# Patient Record
Sex: Female | Born: 1980 | Race: White | Hispanic: No | Marital: Married | Smoking: Former smoker
Health system: Southern US, Community
[De-identification: ages and names within clinical notes are randomized; demographics above are authoritative.]

## PROBLEM LIST (undated history)

## (undated) DIAGNOSIS — F32A Depression, unspecified: Secondary | ICD-10-CM

## (undated) DIAGNOSIS — F1110 Opioid abuse, uncomplicated: Secondary | ICD-10-CM

## (undated) DIAGNOSIS — Z332 Encounter for elective termination of pregnancy: Secondary | ICD-10-CM

## (undated) DIAGNOSIS — F419 Anxiety disorder, unspecified: Secondary | ICD-10-CM

## (undated) DIAGNOSIS — R87629 Unspecified abnormal cytological findings in specimens from vagina: Secondary | ICD-10-CM

## (undated) DIAGNOSIS — N809 Endometriosis, unspecified: Secondary | ICD-10-CM

## (undated) DIAGNOSIS — Z765 Malingerer [conscious simulation]: Secondary | ICD-10-CM

## (undated) DIAGNOSIS — J069 Acute upper respiratory infection, unspecified: Secondary | ICD-10-CM

## (undated) DIAGNOSIS — R569 Unspecified convulsions: Secondary | ICD-10-CM

## (undated) DIAGNOSIS — Z8759 Personal history of other complications of pregnancy, childbirth and the puerperium: Secondary | ICD-10-CM

## (undated) DIAGNOSIS — M797 Fibromyalgia: Secondary | ICD-10-CM

## (undated) DIAGNOSIS — IMO0001 Reserved for inherently not codable concepts without codable children: Secondary | ICD-10-CM

## (undated) DIAGNOSIS — F329 Major depressive disorder, single episode, unspecified: Secondary | ICD-10-CM

## (undated) HISTORY — DX: Reserved for inherently not codable concepts without codable children: IMO0001

## (undated) HISTORY — PX: OTHER SURGICAL HISTORY: SHX169

## (undated) HISTORY — PX: COLPOSCOPY: SHX161

## (undated) HISTORY — DX: Personal history of other complications of pregnancy, childbirth and the puerperium: Z87.59

---

## 1998-05-28 ENCOUNTER — Other Ambulatory Visit: Admission: RE | Admit: 1998-05-28 | Discharge: 1998-05-28 | Payer: Self-pay | Admitting: Obstetrics and Gynecology

## 1998-06-26 HISTORY — PX: DILATION AND CURETTAGE OF UTERUS: SHX78

## 1998-11-21 ENCOUNTER — Emergency Department (HOSPITAL_COMMUNITY): Admission: EM | Admit: 1998-11-21 | Discharge: 1998-11-21 | Payer: Self-pay | Admitting: Emergency Medicine

## 1998-12-22 ENCOUNTER — Inpatient Hospital Stay (HOSPITAL_COMMUNITY): Admission: RE | Admit: 1998-12-22 | Discharge: 1998-12-24 | Payer: Self-pay | Admitting: *Deleted

## 1998-12-27 ENCOUNTER — Ambulatory Visit (HOSPITAL_COMMUNITY): Admission: RE | Admit: 1998-12-27 | Discharge: 1999-01-24 | Payer: Self-pay

## 1999-03-27 DIAGNOSIS — Z332 Encounter for elective termination of pregnancy: Secondary | ICD-10-CM

## 1999-03-27 HISTORY — DX: Encounter for elective termination of pregnancy: Z33.2

## 1999-04-18 ENCOUNTER — Other Ambulatory Visit: Admission: RE | Admit: 1999-04-18 | Discharge: 1999-04-18 | Payer: Self-pay | Admitting: Family Medicine

## 1999-08-17 ENCOUNTER — Emergency Department (HOSPITAL_COMMUNITY): Admission: EM | Admit: 1999-08-17 | Discharge: 1999-08-17 | Payer: Self-pay | Admitting: Emergency Medicine

## 1999-08-18 ENCOUNTER — Encounter (INDEPENDENT_AMBULATORY_CARE_PROVIDER_SITE_OTHER): Payer: Self-pay

## 1999-08-18 ENCOUNTER — Other Ambulatory Visit: Admission: RE | Admit: 1999-08-18 | Discharge: 1999-08-18 | Payer: Self-pay | Admitting: Obstetrics and Gynecology

## 2000-02-19 ENCOUNTER — Emergency Department (HOSPITAL_COMMUNITY): Admission: EM | Admit: 2000-02-19 | Discharge: 2000-02-19 | Payer: Self-pay | Admitting: Emergency Medicine

## 2000-02-19 ENCOUNTER — Encounter: Payer: Self-pay | Admitting: Emergency Medicine

## 2000-03-19 ENCOUNTER — Other Ambulatory Visit: Admission: RE | Admit: 2000-03-19 | Discharge: 2000-03-19 | Payer: Self-pay | Admitting: Family Medicine

## 2000-06-26 HISTORY — PX: WISDOM TOOTH EXTRACTION: SHX21

## 2000-08-16 ENCOUNTER — Other Ambulatory Visit: Admission: RE | Admit: 2000-08-16 | Discharge: 2000-08-16 | Payer: Self-pay | Admitting: Family Medicine

## 2001-06-26 DIAGNOSIS — Z8759 Personal history of other complications of pregnancy, childbirth and the puerperium: Secondary | ICD-10-CM

## 2001-06-26 HISTORY — DX: Personal history of other complications of pregnancy, childbirth and the puerperium: Z87.59

## 2001-12-24 HISTORY — PX: DILATION AND EVACUATION: SHX1459

## 2002-01-01 ENCOUNTER — Inpatient Hospital Stay (HOSPITAL_COMMUNITY): Admission: EM | Admit: 2002-01-01 | Discharge: 2002-01-06 | Payer: Self-pay | Admitting: Psychiatry

## 2002-01-06 ENCOUNTER — Encounter: Payer: Self-pay | Admitting: Emergency Medicine

## 2002-01-06 ENCOUNTER — Encounter (INDEPENDENT_AMBULATORY_CARE_PROVIDER_SITE_OTHER): Payer: Self-pay | Admitting: *Deleted

## 2002-01-06 ENCOUNTER — Ambulatory Visit (HOSPITAL_COMMUNITY): Admission: AD | Admit: 2002-01-06 | Discharge: 2002-01-06 | Payer: Self-pay | Admitting: Obstetrics and Gynecology

## 2002-01-07 ENCOUNTER — Other Ambulatory Visit (HOSPITAL_COMMUNITY): Admission: RE | Admit: 2002-01-07 | Discharge: 2002-01-31 | Payer: Self-pay | Admitting: Internal Medicine

## 2002-03-11 ENCOUNTER — Encounter: Payer: Self-pay | Admitting: Family Medicine

## 2002-03-11 ENCOUNTER — Encounter: Admission: RE | Admit: 2002-03-11 | Discharge: 2002-03-11 | Payer: Self-pay | Admitting: Family Medicine

## 2003-01-10 ENCOUNTER — Emergency Department (HOSPITAL_COMMUNITY): Admission: AD | Admit: 2003-01-10 | Discharge: 2003-01-11 | Payer: Self-pay | Admitting: Emergency Medicine

## 2003-07-03 ENCOUNTER — Emergency Department (HOSPITAL_COMMUNITY): Admission: AD | Admit: 2003-07-03 | Discharge: 2003-07-03 | Payer: Self-pay

## 2003-07-13 ENCOUNTER — Emergency Department (HOSPITAL_COMMUNITY): Admission: AD | Admit: 2003-07-13 | Discharge: 2003-07-13 | Payer: Self-pay | Admitting: Family Medicine

## 2003-12-07 ENCOUNTER — Ambulatory Visit (HOSPITAL_COMMUNITY): Admission: RE | Admit: 2003-12-07 | Discharge: 2003-12-07 | Payer: Self-pay | Admitting: Psychiatry

## 2004-01-06 ENCOUNTER — Encounter: Admission: RE | Admit: 2004-01-06 | Discharge: 2004-01-06 | Payer: Self-pay | Admitting: *Deleted

## 2004-01-20 ENCOUNTER — Encounter: Admission: RE | Admit: 2004-01-20 | Discharge: 2004-01-20 | Payer: Self-pay | Admitting: *Deleted

## 2004-02-10 ENCOUNTER — Encounter: Admission: RE | Admit: 2004-02-10 | Discharge: 2004-02-10 | Payer: Self-pay | Admitting: *Deleted

## 2004-03-02 ENCOUNTER — Ambulatory Visit (HOSPITAL_COMMUNITY): Admission: RE | Admit: 2004-03-02 | Discharge: 2004-03-02 | Payer: Self-pay | Admitting: *Deleted

## 2004-03-02 ENCOUNTER — Ambulatory Visit: Payer: Self-pay | Admitting: *Deleted

## 2004-03-10 ENCOUNTER — Ambulatory Visit: Payer: Self-pay | Admitting: Obstetrics and Gynecology

## 2004-03-23 ENCOUNTER — Ambulatory Visit: Payer: Self-pay | Admitting: *Deleted

## 2004-04-07 ENCOUNTER — Ambulatory Visit: Payer: Self-pay | Admitting: Family Medicine

## 2004-04-18 ENCOUNTER — Inpatient Hospital Stay (HOSPITAL_COMMUNITY): Admission: AD | Admit: 2004-04-18 | Discharge: 2004-04-18 | Payer: Self-pay | Admitting: Obstetrics & Gynecology

## 2004-04-27 ENCOUNTER — Ambulatory Visit: Payer: Self-pay | Admitting: Obstetrics & Gynecology

## 2004-05-11 ENCOUNTER — Ambulatory Visit: Payer: Self-pay | Admitting: Family Medicine

## 2004-05-11 ENCOUNTER — Ambulatory Visit (HOSPITAL_COMMUNITY): Admission: RE | Admit: 2004-05-11 | Discharge: 2004-05-11 | Payer: Self-pay | Admitting: Obstetrics & Gynecology

## 2004-05-26 ENCOUNTER — Ambulatory Visit: Payer: Self-pay | Admitting: Family Medicine

## 2004-06-16 ENCOUNTER — Ambulatory Visit: Payer: Self-pay | Admitting: Family Medicine

## 2004-06-23 ENCOUNTER — Ambulatory Visit: Payer: Self-pay | Admitting: Family Medicine

## 2004-06-30 ENCOUNTER — Ambulatory Visit: Payer: Self-pay | Admitting: Family Medicine

## 2004-07-07 ENCOUNTER — Ambulatory Visit: Payer: Self-pay | Admitting: Family Medicine

## 2004-07-14 ENCOUNTER — Ambulatory Visit: Payer: Self-pay | Admitting: Family Medicine

## 2004-07-21 ENCOUNTER — Ambulatory Visit: Payer: Self-pay | Admitting: Family Medicine

## 2004-07-27 ENCOUNTER — Ambulatory Visit: Payer: Self-pay | Admitting: *Deleted

## 2004-07-28 ENCOUNTER — Ambulatory Visit: Payer: Self-pay | Admitting: Obstetrics and Gynecology

## 2004-07-28 ENCOUNTER — Inpatient Hospital Stay (HOSPITAL_COMMUNITY): Admission: AD | Admit: 2004-07-28 | Discharge: 2004-08-02 | Payer: Self-pay | Admitting: *Deleted

## 2004-07-31 ENCOUNTER — Encounter (INDEPENDENT_AMBULATORY_CARE_PROVIDER_SITE_OTHER): Payer: Self-pay | Admitting: *Deleted

## 2004-08-11 ENCOUNTER — Emergency Department (HOSPITAL_COMMUNITY): Admission: EM | Admit: 2004-08-11 | Discharge: 2004-08-11 | Payer: Self-pay | Admitting: Family Medicine

## 2005-11-09 ENCOUNTER — Emergency Department (HOSPITAL_COMMUNITY): Admission: EM | Admit: 2005-11-09 | Discharge: 2005-11-09 | Payer: Self-pay | Admitting: Emergency Medicine

## 2005-12-11 ENCOUNTER — Emergency Department (HOSPITAL_COMMUNITY): Admission: EM | Admit: 2005-12-11 | Discharge: 2005-12-11 | Payer: Self-pay | Admitting: Family Medicine

## 2006-02-24 ENCOUNTER — Emergency Department (HOSPITAL_COMMUNITY): Admission: EM | Admit: 2006-02-24 | Discharge: 2006-02-24 | Payer: Self-pay | Admitting: Family Medicine

## 2006-02-27 ENCOUNTER — Emergency Department (HOSPITAL_COMMUNITY): Admission: EM | Admit: 2006-02-27 | Discharge: 2006-02-27 | Payer: Self-pay | Admitting: Family Medicine

## 2006-03-29 ENCOUNTER — Emergency Department (HOSPITAL_COMMUNITY): Admission: EM | Admit: 2006-03-29 | Discharge: 2006-03-29 | Payer: Self-pay | Admitting: Family Medicine

## 2006-07-20 ENCOUNTER — Emergency Department (HOSPITAL_COMMUNITY): Admission: EM | Admit: 2006-07-20 | Discharge: 2006-07-20 | Payer: Self-pay | Admitting: Family Medicine

## 2007-02-10 ENCOUNTER — Emergency Department (HOSPITAL_COMMUNITY): Admission: EM | Admit: 2007-02-10 | Discharge: 2007-02-10 | Payer: Self-pay | Admitting: Emergency Medicine

## 2007-04-08 ENCOUNTER — Emergency Department (HOSPITAL_COMMUNITY): Admission: EM | Admit: 2007-04-08 | Discharge: 2007-04-08 | Payer: Self-pay | Admitting: Emergency Medicine

## 2007-08-30 ENCOUNTER — Emergency Department (HOSPITAL_COMMUNITY): Admission: EM | Admit: 2007-08-30 | Discharge: 2007-08-30 | Payer: Self-pay | Admitting: Family Medicine

## 2007-10-18 ENCOUNTER — Ambulatory Visit: Payer: Self-pay | Admitting: Internal Medicine

## 2007-10-18 ENCOUNTER — Encounter (INDEPENDENT_AMBULATORY_CARE_PROVIDER_SITE_OTHER): Payer: Self-pay | Admitting: Nurse Practitioner

## 2007-10-18 LAB — CONVERTED CEMR LAB
Albumin: 4.7 g/dL (ref 3.5–5.2)
Alkaline Phosphatase: 38 units/L — ABNORMAL LOW (ref 39–117)
BUN: 16 mg/dL (ref 6–23)
Calcium: 9.2 mg/dL (ref 8.4–10.5)
Chloride: 105 meq/L (ref 96–112)
Eosinophils Absolute: 0.2 10*3/uL (ref 0.0–0.7)
Glucose, Bld: 84 mg/dL (ref 70–99)
Hemoglobin: 13.4 g/dL (ref 12.0–15.0)
Lymphs Abs: 2 10*3/uL (ref 0.7–4.0)
MCV: 92.5 fL (ref 78.0–100.0)
Monocytes Relative: 6 % (ref 3–12)
Neutrophils Relative %: 60 % (ref 43–77)
Potassium: 4 meq/L (ref 3.5–5.3)
RBC: 4.26 M/uL (ref 3.87–5.11)
WBC: 6.5 10*3/uL (ref 4.0–10.5)

## 2007-10-21 ENCOUNTER — Ambulatory Visit (HOSPITAL_COMMUNITY): Admission: RE | Admit: 2007-10-21 | Discharge: 2007-10-21 | Payer: Self-pay | Admitting: Family Medicine

## 2008-04-01 ENCOUNTER — Emergency Department (HOSPITAL_COMMUNITY): Admission: EM | Admit: 2008-04-01 | Discharge: 2008-04-01 | Payer: Self-pay | Admitting: Emergency Medicine

## 2008-06-28 ENCOUNTER — Emergency Department (HOSPITAL_COMMUNITY): Admission: EM | Admit: 2008-06-28 | Discharge: 2008-06-28 | Payer: Self-pay | Admitting: Family Medicine

## 2008-07-06 ENCOUNTER — Emergency Department (HOSPITAL_COMMUNITY): Admission: EM | Admit: 2008-07-06 | Discharge: 2008-07-06 | Payer: Self-pay | Admitting: Family Medicine

## 2008-08-02 ENCOUNTER — Emergency Department (HOSPITAL_COMMUNITY): Admission: EM | Admit: 2008-08-02 | Discharge: 2008-08-02 | Payer: Self-pay | Admitting: Emergency Medicine

## 2008-11-18 ENCOUNTER — Other Ambulatory Visit: Admission: RE | Admit: 2008-11-18 | Discharge: 2008-11-18 | Payer: Self-pay | Admitting: Family Medicine

## 2009-04-29 ENCOUNTER — Inpatient Hospital Stay (HOSPITAL_COMMUNITY): Admission: AD | Admit: 2009-04-29 | Discharge: 2009-04-30 | Payer: Self-pay | Admitting: Obstetrics and Gynecology

## 2009-08-25 ENCOUNTER — Inpatient Hospital Stay (HOSPITAL_COMMUNITY): Admission: AD | Admit: 2009-08-25 | Discharge: 2009-08-25 | Payer: Self-pay | Admitting: Obstetrics and Gynecology

## 2009-08-30 ENCOUNTER — Inpatient Hospital Stay (HOSPITAL_COMMUNITY): Admission: AD | Admit: 2009-08-30 | Discharge: 2009-09-01 | Payer: Self-pay | Admitting: Obstetrics and Gynecology

## 2010-04-14 ENCOUNTER — Encounter: Admission: RE | Admit: 2010-04-14 | Discharge: 2010-04-14 | Payer: Self-pay | Admitting: Family Medicine

## 2010-07-17 ENCOUNTER — Encounter: Payer: Self-pay | Admitting: Psychiatry

## 2010-09-18 LAB — COMPREHENSIVE METABOLIC PANEL
ALT: 11 U/L (ref 0–35)
AST: 30 U/L (ref 0–37)
Calcium: 7.8 mg/dL — ABNORMAL LOW (ref 8.4–10.5)
GFR calc Af Amer: >60 mL/min (ref >60)
Sodium: 134 mEq/L — ABNORMAL LOW (ref 135–145)
Total Protein: 4.4 g/dL — ABNORMAL LOW (ref 6.0–8.3)

## 2010-09-18 LAB — CBC
Hemoglobin: 10.8 g/dL — ABNORMAL LOW (ref 12.0–15.0)
MCHC: 33.3 g/dL (ref 30.0–36.0)
MCV: 100.1 fL — ABNORMAL HIGH (ref 78.0–100.0)
Platelets: 211 10*3/uL (ref 150–400)
RBC: 3.24 MIL/uL — ABNORMAL LOW (ref 3.87–5.11)
RDW: 13.2 % (ref 11.5–15.5)

## 2010-09-18 LAB — LACTATE DEHYDROGENASE: LDH: 181 U/L (ref 94–250)

## 2010-09-28 LAB — DIFFERENTIAL
Basophils Absolute: 0 10*3/uL (ref 0.0–0.1)
Eosinophils Relative: 0 % (ref 0–5)
Lymphocytes Relative: 7 % — ABNORMAL LOW (ref 12–46)
Lymphs Abs: 0.6 10*3/uL — ABNORMAL LOW (ref 0.7–4.0)
Neutro Abs: 7.7 10*3/uL (ref 1.7–7.7)
Neutrophils Relative %: 87 % — ABNORMAL HIGH (ref 43–77)

## 2010-09-28 LAB — URINALYSIS, ROUTINE W REFLEX MICROSCOPIC
Glucose, UA: 100 mg/dL — AB
Nitrite: NEGATIVE
Specific Gravity, Urine: 1.015 (ref 1.005–1.030)
pH: 7.5 (ref 5.0–8.0)

## 2010-09-28 LAB — COMPREHENSIVE METABOLIC PANEL
ALT: 12 U/L (ref 0–35)
CO2: 24 mEq/L (ref 19–32)
Calcium: 8 mg/dL — ABNORMAL LOW (ref 8.4–10.5)
Chloride: 105 mEq/L (ref 96–112)
GFR calc non Af Amer: >60 mL/min (ref >60)
Glucose, Bld: 83 mg/dL (ref 70–99)
Sodium: 134 mEq/L — ABNORMAL LOW (ref 135–145)
Total Bilirubin: 0.7 mg/dL (ref 0.3–1.2)

## 2010-09-28 LAB — CBC
HCT: 32.3 % — ABNORMAL LOW (ref 36.0–46.0)
Platelets: 212 10*3/uL (ref 150–400)
WBC: 8.6 10*3/uL (ref 4.0–10.5)

## 2010-10-11 LAB — URINALYSIS, ROUTINE W REFLEX MICROSCOPIC
Nitrite: NEGATIVE
Specific Gravity, Urine: 1.029 (ref 1.005–1.030)
Urobilinogen, UA: 1 mg/dL (ref 0.0–1.0)
pH: 5.5 (ref 5.0–8.0)

## 2010-10-11 LAB — POCT I-STAT, CHEM 8
Calcium, Ion: 1.16 mmol/L (ref 1.12–1.32)
Creatinine, Ser: 0.7 mg/dL (ref 0.4–1.2)
Glucose, Bld: 89 mg/dL (ref 70–99)
Hemoglobin: 13.6 g/dL (ref 12.0–15.0)
Potassium: 3.6 mEq/L (ref 3.5–5.1)

## 2010-10-11 LAB — URINE CULTURE
Colony Count: NO GROWTH
Culture: NO GROWTH

## 2010-10-11 LAB — DIFFERENTIAL
Basophils Absolute: 0 10*3/uL (ref 0.0–0.1)
Basophils Relative: 0 % (ref 0–1)
Eosinophils Absolute: 0.2 10*3/uL (ref 0.0–0.7)
Eosinophils Relative: 2 % (ref 0–5)
Lymphs Abs: 1.3 10*3/uL (ref 0.7–4.0)
Neutrophils Relative %: 69 % (ref 43–77)

## 2010-10-11 LAB — URINE MICROSCOPIC-ADD ON

## 2010-10-11 LAB — CBC
HCT: 37.7 % (ref 36.0–46.0)
MCHC: 35 g/dL (ref 30.0–36.0)
MCV: 93.8 fL (ref 78.0–100.0)
Platelets: 244 10*3/uL (ref 150–400)
RDW: 12.6 % (ref 11.5–15.5)

## 2010-10-11 LAB — MONONUCLEOSIS SCREEN: Mono Screen: NEGATIVE

## 2010-11-11 NOTE — Op Note (Signed)
[facility]  Patient:    Emily Woodard, Emily Woodard Visit Number: [id number] MRN: [id number]          Service Type: DSU Location: [facility] Attending Physician:  Leonard Schwartz Dictated by:   Janine Limbo, M.D. Proc. Date: 01/06/02 Admit Date:  01/06/2002   CC:         Charlies Silvers, M.D.  Jeanice Lim, M.D.   Operative Report  PREOPERATIVE DIAGNOSES:       1. First trimester threatened abortion.                               2. Rule out right ectopic pregnancy.  POSTOPERATIVE DIAGNOSES:      1. First trimester threatened abortion.                               2. Rule out right ectopic pregnancy.  PROCEDURE:                    Suction dilatation and evacuation.  SURGEON:                      Janine Limbo, M.D.  FIRST ASSISTANT:              None.  ANESTHESIA:                   Monitored anesthetic control, paracervical block.  DISPOSITION:                  Ms. Emily Woodard is a 30 year old female gravida 2, para 0-0-1-0 who presents with a questionable last menstrual period of November 30, 2001.  This makes the patient 5 weeks and 4 days pregnant.  The patient has had vaginal spotting and right lower quadrant pain.  The patient had a quantitative beta hCG done on January 01, 2002 that was 1800.  A repeat quantative beta hCG was done on the morning of surgery and it was 1284.  An ultrasound was obtained that showed no intrauterine pregnancy.  There was a thick walled 2 cm cystic structure in the right adnexa.  The patient has currently been an inpatient at [facility] where she is being treated for substance abuse.  She is doing well at this time.  Her hemoglobin was 38.1%.  Her platelet count was 355,000.  Her SGOT and SGPT as well as BUN and creatinine are within normal limits.  The patient understands the indications for her surgical procedure and she accepts the risks of, but not limited to, anesthetic complications,  bleeding, infections, and possible damage to the surrounding organs.  The plan is to obtain a frozen section from the pathology specimen.  If no products of conception are discovered then we will proceed with methotrexate therapy.  The protocol is for her to have a repeat quantitative beta hCG performed on day 4 and then again repeat quantitative beta hCG on day 7.  She will have weekly quantitative beta hCG values until her result is 0.  If after one weeks time the patient has not had a significant enough drop in her quantitative beta hCGs, then we will give her a second dose of methotrexate.  FINDINGS:                     The patients blood  type is A+.  A small amount of tissue was removed from within the uterine cavity.  The uterus was noted to be normal size.  There was a cystic structure present in the right adnexa.  PROCEDURE:                    The patient was taken to the operating room where she was given medication through her IV line.  The patients perineum and vagina were prepped with multiple layers of Betadine.  The bladder was drained of urine.  Examination under anesthesia was performed.  The patient was sterilely draped.  A paracervical block was placed using 10 cc of 0.5% Marcaine.  The uterus sounded to 9 cm.  The cervix was gradually dilated.  The uterine cavity was evacuated using a #8 suction curette followed by a medium sharp curette.  The cavity was felt to be clean at the end of our procedure. The specimen was sent to pathology for frozen section.  Repeat examination showed the uterus to be firm.  Hemostasis was adequate.  The estimated blood loss was 20 cc.  The patient tolerated her procedure well.  She was taken to the recovery room in stable condition.  FOLLOW-UP INSTRUCTIONS:       We will obtain the frozen section result from the [facility] specimen.  We will then proceed with methotrexate therapy if no products of conception are noted.  If the patient is found to  have pregnancy tissue within the uterus, then no additional therapy is needed.  We will plan to have her follow up in my office in one to two weeks for follow-up examination.  She was given ibuprofen 600 mg q.6h. as needed for pain. Dictated by:   Janine Limbo, M.D. Attending Physician:  Leonard Schwartz DD:  01/06/02 TD:  01/07/02 Job: [phone]

## 2010-11-11 NOTE — H&P (Signed)
[facility]  Patient:    Emily Woodard, Emily Woodard Visit Number: [id number] MRN: [id number]          Service Type: EMS Location: ED Attending Physician:  Sandi Raveling Dictated by:   Young Berry Scott, N.P. Admit Date:  01/01/2002 Discharge Date: 01/01/2002                     Psychiatric Admission Assessment  DATE OF ASSESSMENT:  January 02, 2002 at 9:45 a.m.  IDENTIFYING INFORMATION:  This is a 30 year old Caucasian female who is a voluntary admission.  HISTORY OF PRESENT ILLNESS:  This 30 year old female, with a history of polysubstance abuse, starting with marijuana at age 47, began using heroin around January of this year for the first time.  She reports escalating tolerance in use to the point where she was using 2-3 bags of heroin per day, usually snorting it and periodically using it IV.  She became determined to get herself off the heroin and had tapered herself down to approximately one-half bag in an effort to detox and experienced depression, muscle aches and significant withdrawal symptoms.  She began having suicidal thoughts and hopelessness, when she began to seek help for her detox effort.  She presented in the emergency room requesting detox from opiates and, at that time, was also found to be four weeks pregnant.  The patient denies any abuse of alcohol, benzodiazepines.  She denies any homicidal ideation or auditory or visual hallucinations.  She continues to have some suicidal thoughts and is not able to promise safety in the community, although she can contract for safety on the unit.  She denies any homicidal ideation or auditory or visual hallucinations.  PAST PSYCHIATRIC HISTORY:  The patient is followed by Dr. Charlies Silvers and patient is generally compliant with her appointments.  She does have a history of one prior detox in 1999 at [facility].  The patient has a history of depression and panic disorder and has  been managed on Klonopin 2 mg p.o. daily and Effexor 150 mg b.i.d. until she saw Dr. Nolen Mu on December 31, 2001, at which time, Dr. Nolen Mu discontinued her Klonopin and decreased her Effexor to 150 mg with the intent of tapering this off and changing her SSRI, this according to the patient.  The patient denies any previous history of suicide attempt.  SOCIAL HISTORY:  The patient is from [place] originally.  She is single and engaged to be married.  She has two years of college education.  She is currently living with her mother and father here in [place].  She has one sibling, a brother who is alive and well.  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  She has a supportive fiance and parents.  She denies any legal issues or financial issues and is generally pleased that she is pregnant.  FAMILY HISTORY:  History of alcohol abuse "on both sides of the family" which she is unable to be more specific about.  ALCOHOL/DRUG HISTORY:  The patients urine drug screen was positive for cocaine, opiates and benzodiazepines.  She does admit to occasional use of cocaine on an irregular basis.  MEDICAL HISTORY:  The patient is followed by Dr. Arvilla Market, who is her primary care physician.  In the past, she has seen Dr. Stefano Gaul at [facility] for some cervical dysplasia approximately two years ago.  Current medical problems, patient reports, are seizure disorder.  Her last seizure was  more than a year ago.  She reports that she had, several years ago, two blackout spells and then, when she had a third blackout spell, she was seen by a physician and diagnosed with seizures but never placed on medications.  The patient denies any history of sexually transmitted disease.  She does have a history of a therapeutic abortion in October of 2001.  She denies any other health problems and denies any somatic complaints.  MEDICATIONS:  Effexor XR 75 mg p.o. b.i.d. and Klonopin 2 mg p.o. daily.   This was discontinued on December 31, 2001.  DRUG ALLERGIES:  None.  POSITIVE PHYSICAL FINDINGS:  The patient was medically cleared in the emergency room where her pregnancy was diagnosed.  Her hCG was 1898 units. Estimated to be approximately four weeks pregnant.  The patient has no somatic complaints today other than a white, milky, vaginal discharge.  She denies any bleeding.  No fever, chills or sweats.  Vital signs, on admission to the unit, were temperature 98.8, pulse 70, respirations 22, blood pressure 122/70.  Her weight is 135 pounds and she is approximately 5 feet 3-1/2 inches tall.  LABORATORY DATA:  The patients CBC was found to be within normal limits.  RBC was slightly decreased at 3.81, hemoglobin 12.3, hematocrit 35.2, platelets 335,000, MCV 92.4.  Metabolic panel showed electrolytes within normal limits. Her glucose was mildly elevated at 119.  Her albumin was 3.4.  The patients thyroid panel reveals TSH of 0.670 and a mildly decreased T4 at 0.76.  The patients alcohol level was less than 5.  Her urinalysis was within normal limits.  Her blood type was A+.  Her RPR was nonreactive.  A wet prep was done on her and, at that time, it should no yeast, no Trichomonas.  Clue cells were rare, wbcs were none.  GC and chlamydia probes were negative.  MENTAL STATUS EXAMINATION:  This is a healthy-appearing Caucasian female who is in no acute distress with a blunted affect.  She is alert, cooperative and polite.  She has been reclusive in her room today, mostly staying in bed with the covers pulled up and says that she is very sleepy and feels very tired. Generally, she is cooperative and readily participates in interview.  Speech is normal, relevant.  Mood is mildly anxious.  She does have considerable guilt over her drug use combined with her pregnancy.  She is elated about the pregnancy and looking forward to having the baby and she plans on marrying the babys father.  She does  continue to have some vague suicidal ideation without any specific plan and feels quite conflicted over her guilt regarding her drug  use and her pregnancy and she is very fearful and unable to promise safety in the community.  She feels hopeless to be able to detox without help and this is contributing significantly to her depression.  Thought process is logical and goal directed.  She does have strong motivation to get and stay clean.  No evidence of homicidal ideation.  No delusions or paranoia.  No psychosis. Cognitively, she is intact and oriented x 3.  Intelligence is average. Insight is fair.  Judgment questionable.  Impulse control questionable.  DIAGNOSES: Axis I:    1. 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            2. Panic disorder not otherwise specified.            3. Benzodiazepine dependence. Axis II:   Deferred.  Axis III:  1. Uterine pregnancy, approximately four weeks gestation.            2. Seizure disorder not otherwise specified. Axis IV:   Significant medical problems, being a history of opiate dependence            in combination with her pregnancy. Axis V:    Current 46; past year 29.  PLAN:  Voluntarily admit the patient to detox her from opiates in light of her newly diagnosed pregnancy.  Goal is safe initiation of a detox in coordination with her OB/GYN and our additional goal is to alleviate any suicidal thoughts, that she has strength in her coping and prepare her for reinforcement of clean life without relapse.  We have elected to place her on Ativan 0.5 mg p.o. q.6h. p.r.n. for any withdrawal symptoms from her Klonopin and we will no restart that.  We have elected to give her Effexor XR 75 mg p.o. b.i.d. and will taper off that and will consider changing to a different SSRI.  We will be contacting Dr. Charlies Silvers to obtain some additional medication history.  We will monitor her opiate withdrawal symptoms and have elected not to place her on an  opiate withdrawal protocol, at least not until we are able to get in touch with her OB/GYN, Dr. Stefano Gaul for some additional input.  We will be checking her vital signs b.i.d. and have also placed her on seizure precautions for safety.  ESTIMATED LENGTH OF STAY:  Three to four days. Dictated by:   Young Berry Scott, N.P. Attending Physician:  Sandi Raveling DD:  01/03/02 TD:  01/04/02 Job: [id number]

## 2010-11-11 NOTE — Discharge Summary (Signed)
NAME:  Emily Woodard, Emily Woodard                        ACCOUNT NO.:  [id number]   MEDICAL RECORD NO.:  [id number]                   PATIENT TYPE:  IPS   LOCATION:  0503                                 FACILITY:  [facility]   PHYSICIAN:  Jeanice Lim, MD                DATE OF BIRTH:  March 09, 1981   DATE OF ADMISSION:  01/01/2002  DATE OF DISCHARGE:  01/06/2002                                 DISCHARGE SUMMARY   IDENTIFYING DATA:  This is a 30 year old Caucasian female voluntarily  admitted with a history of polysubstance abuse, using heroin since January.  Found to be four weeks pregnant.  Referred by her psychiatrist.  Reporting  vague suicidal ideation.  Also using cocaine.  Here for stabilization of  mood and detox.   MEDICATIONS:  Effexor and Klonopin.   ALLERGIES:  No known drug allergies.   PHYSICAL EXAMINATION:  Essentially within normal limits.  Neurologically  nonfocal.   MENTAL STATUS EXAM:  Healthy-appearing white female in no acute distress.  Cooperative.  Polite.  Speech within normal limits.  Mood euthymic, mildly  anxious with vague suicidal ideation.  Thought process goal directed.  No  psychotic symptoms.  Cognitively intact.  Judgment and insight fair to poor  based on history.   ADMISSION DIAGNOSES:   AXIS I:  1. Opiate dependence.  2. Panic disorder not otherwise specified.  3. Benzodiazepine dependence.   AXIS II:  None.   AXIS III:  1. Four-week pregnancy.  2. History of seizure disorder.   AXIS IV:  Significant (related to pregnancy and substance abuse).   AXIS V:  40/65.   HOSPITAL COURSE:  The patient was admitted and ordered routine p.r.n.  medications.  Participated in individual, group and milieu therapy.  The  patient was ordered Ativan p.r.n. withdrawal symptoms and, after the  risk/benefit ratio and alternatives were discussed, patient was tapered off  of Effexor and titrated on to Zoloft due to the safety in regard to her  pregnancy.  The  patient agreed to follow up with the CD IOP and with Dr.  Nolen Mu.  She tolerated the medication change and withdrawal and was doing  quite well until she experienced some bleeding, resulting in her needing to  be discharged down to the [facility] for an evaluation.   CONDITION ON DISCHARGE:  Improved.  Mood was more euthymic.  Affect  brighter.  Thought process goal directed.  Thought content negative for  dangerous ideation or psychotic symptoms.  The patient reported motivation  to be compliant with follow-up plan.   DISCHARGE MEDICATIONS:  Zoloft 50 mg, 1/2 q.a.m.   FOLLOW UP:  [facility] on January 07, 2002 at 4 p.m. and  Dr. Nolen Mu on February 03, 2002 at 10:45 a.m.   DISCHARGE DIAGNOSES:   AXIS I:  1. Opiate dependence.  2. Panic disorder not otherwise specified.  3. Benzodiazepine dependence.  AXIS II:  None.   AXIS III:  1. Four-week pregnancy.  2. History of seizure disorder.   AXIS IV:  Significant (related to pregnancy and substance abuse).   AXIS V:  Global Assessment of Functioning on discharge 60.                                                 Jeanice Lim, MD    JEM/MEDQ  D:  02/19/2002  T:  02/19/2002  Job:  [id number]

## 2011-03-26 ENCOUNTER — Emergency Department (HOSPITAL_COMMUNITY): Payer: Managed Care, Other (non HMO)

## 2011-03-26 ENCOUNTER — Emergency Department (HOSPITAL_COMMUNITY)
Admission: EM | Admit: 2011-03-26 | Discharge: 2011-03-26 | Disposition: A | Discharge status: Home / Self Care | Payer: Managed Care, Other (non HMO) | Attending: Emergency Medicine | Admitting: Emergency Medicine

## 2011-03-26 DIAGNOSIS — R110 Nausea: Secondary | ICD-10-CM | POA: Insufficient documentation

## 2011-03-26 DIAGNOSIS — K5900 Constipation, unspecified: Secondary | ICD-10-CM | POA: Insufficient documentation

## 2011-03-26 DIAGNOSIS — R109 Unspecified abdominal pain: Secondary | ICD-10-CM | POA: Insufficient documentation

## 2011-03-26 DIAGNOSIS — G40909 Epilepsy, unspecified, not intractable, without status epilepticus: Secondary | ICD-10-CM | POA: Insufficient documentation

## 2011-03-26 LAB — URINALYSIS, ROUTINE W REFLEX MICROSCOPIC
Bilirubin Urine: NEGATIVE
Glucose, UA: NEGATIVE mg/dL
Hgb urine dipstick: NEGATIVE
Protein, ur: NEGATIVE mg/dL
Specific Gravity, Urine: 1.023 (ref 1.005–1.030)

## 2011-03-26 LAB — DIFFERENTIAL
Basophils Absolute: 0 10*3/uL (ref 0.0–0.1)
Lymphocytes Relative: 33 % (ref 12–46)
Neutro Abs: 3.5 10*3/uL (ref 1.7–7.7)

## 2011-03-26 LAB — COMPREHENSIVE METABOLIC PANEL
Alkaline Phosphatase: 41 U/L (ref 39–117)
BUN: 13 mg/dL (ref 6–23)
CO2: 26 mEq/L (ref 19–32)
Chloride: 105 mEq/L (ref 96–112)
Glucose, Bld: 70 mg/dL (ref 70–99)
Potassium: 3.7 mEq/L (ref 3.5–5.1)
Total Bilirubin: 1.3 mg/dL — ABNORMAL HIGH (ref 0.3–1.2)

## 2011-03-26 LAB — CBC
HCT: 38.7 % (ref 36.0–46.0)
Hemoglobin: 14.1 g/dL (ref 12.0–15.0)
WBC: 6.8 10*3/uL (ref 4.0–10.5)

## 2011-03-26 LAB — WET PREP, GENITAL: Clue Cells Wet Prep HPF POC: NONE SEEN

## 2011-03-26 LAB — URINE MICROSCOPIC-ADD ON

## 2011-03-26 LAB — AMYLASE: Amylase: 53 U/L (ref 0–105)

## 2011-03-26 MED ORDER — IOHEXOL 300 MG/ML  SOLN
100.0000 mL | Freq: Once | INTRAMUSCULAR | Status: AC | PRN
  Administered 2011-03-26: 100 mL via INTRAVENOUS

## 2011-03-27 LAB — GC/CHLAMYDIA PROBE AMP, GENITAL: Chlamydia, DNA Probe: NEGATIVE

## 2011-03-28 LAB — URINE MICROSCOPIC-ADD ON

## 2011-03-28 LAB — POCT I-STAT, CHEM 8
Calcium, Ion: 1.11 — ABNORMAL LOW
Chloride: 105
HCT: 36
Potassium: 3.4 — ABNORMAL LOW

## 2011-03-28 LAB — URINALYSIS, ROUTINE W REFLEX MICROSCOPIC
Ketones, ur: 15 — AB
Nitrite: NEGATIVE
Urobilinogen, UA: 1
pH: 6.5

## 2011-03-28 LAB — RAPID URINE DRUG SCREEN, HOSP PERFORMED
Barbiturates: NOT DETECTED
Benzodiazepines: NOT DETECTED
Cocaine: NOT DETECTED

## 2011-03-28 LAB — POCT PREGNANCY, URINE: Preg Test, Ur: NEGATIVE

## 2011-03-28 LAB — POCT CARDIAC MARKERS
Myoglobin, poc: 57.8
Troponin i, poc: <0.05

## 2011-03-28 LAB — D-DIMER, QUANTITATIVE: D-Dimer, Quant: <0.22

## 2011-04-07 LAB — POCT PREGNANCY, URINE
Operator id: 235561
Preg Test, Ur: NEGATIVE

## 2011-05-11 ENCOUNTER — Encounter (HOSPITAL_COMMUNITY): Payer: Self-pay | Admitting: Pharmacy Technician

## 2011-05-11 NOTE — Patient Instructions (Addendum)
   Your procedure is scheduled [id number] November 27th  Enter through the Main Entrance of [facility] at:8am Pick up the phone at the desk and dial [phone] and inform us of your arrival.  Please call this number if you have any problems the morning of surgery: [phone]  Remember: Do not eat food after midnight:Monday Do not drink clear liquids after:midnight Monday Take these medicines the morning of surgery with a SIP OF WATER: none  Do not wear jewelry, make-up, or FINGER nail polish Do not wear lotions, powders, or perfumes.  You may not  wear deodorant. Do not shave 48 hours prior to surgery. Do not bring valuables to the hospital.  Patients discharged on the day of surgery will not be allowed to drive home.  Home with husband Orvilla Fus   Remember to use your hibiclens as instructed.Please shower with 1/2 bottle the evening before your surgery and the other 1/2 bottle the morning of surgery.

## 2011-05-16 ENCOUNTER — Encounter (HOSPITAL_COMMUNITY): Payer: Self-pay

## 2011-05-16 ENCOUNTER — Encounter (HOSPITAL_COMMUNITY)
Admission: RE | Admit: 2011-05-16 | Discharge: 2011-05-16 | Disposition: A | Discharge status: Home / Self Care | Payer: Managed Care, Other (non HMO) | Source: Ambulatory Visit | Attending: Obstetrics and Gynecology | Admitting: Obstetrics and Gynecology

## 2011-05-16 HISTORY — DX: Depression, unspecified: F32.A

## 2011-05-16 HISTORY — DX: Major depressive disorder, single episode, unspecified: F32.9

## 2011-05-16 HISTORY — DX: Encounter for elective termination of pregnancy: Z33.2

## 2011-05-16 HISTORY — DX: Unspecified convulsions: R56.9

## 2011-05-16 HISTORY — DX: Acute upper respiratory infection, unspecified: J06.9

## 2011-05-16 HISTORY — DX: Anxiety disorder, unspecified: F41.9

## 2011-05-16 LAB — CBC
Hemoglobin: 13.4 g/dL (ref 12.0–15.0)
MCH: 32 pg (ref 26.0–34.0)
MCHC: 34.9 g/dL (ref 30.0–36.0)
Platelets: 262 10*3/uL (ref 150–400)

## 2011-05-16 LAB — SURGICAL PCR SCREEN
MRSA, PCR: NEGATIVE
Staphylococcus aureus: NEGATIVE

## 2011-05-16 NOTE — Pre-Procedure Instructions (Signed)
Ok to see patient DOS. 

## 2011-05-23 ENCOUNTER — Ambulatory Visit (HOSPITAL_COMMUNITY): Payer: Managed Care, Other (non HMO) | Admitting: Anesthesiology

## 2011-05-23 ENCOUNTER — Encounter (HOSPITAL_COMMUNITY): Payer: Self-pay | Admitting: *Deleted

## 2011-05-23 ENCOUNTER — Encounter (HOSPITAL_COMMUNITY): Payer: Self-pay | Admitting: Anesthesiology

## 2011-05-23 ENCOUNTER — Ambulatory Visit (HOSPITAL_COMMUNITY)
Admission: RE | Admit: 2011-05-23 | Discharge: 2011-05-23 | Disposition: A | Discharge status: Home / Self Care | Payer: Managed Care, Other (non HMO) | Source: Ambulatory Visit | Attending: Obstetrics and Gynecology | Admitting: Obstetrics and Gynecology

## 2011-05-23 ENCOUNTER — Encounter (HOSPITAL_COMMUNITY)
Admission: RE | Disposition: A | Discharge status: Home / Self Care | Payer: Self-pay | Source: Ambulatory Visit | Attending: Obstetrics and Gynecology

## 2011-05-23 DIAGNOSIS — N949 Unspecified condition associated with female genital organs and menstrual cycle: Secondary | ICD-10-CM | POA: Insufficient documentation

## 2011-05-23 DIAGNOSIS — N803 Endometriosis of pelvic peritoneum, unspecified: Secondary | ICD-10-CM | POA: Insufficient documentation

## 2011-05-23 DIAGNOSIS — Z30432 Encounter for removal of intrauterine contraceptive device: Secondary | ICD-10-CM | POA: Insufficient documentation

## 2011-05-23 DIAGNOSIS — IMO0002 Reserved for concepts with insufficient information to code with codable children: Secondary | ICD-10-CM | POA: Diagnosis present

## 2011-05-23 DIAGNOSIS — Z302 Encounter for sterilization: Secondary | ICD-10-CM | POA: Insufficient documentation

## 2011-05-23 DIAGNOSIS — Z01818 Encounter for other preprocedural examination: Secondary | ICD-10-CM | POA: Insufficient documentation

## 2011-05-23 DIAGNOSIS — Z01812 Encounter for preprocedural laboratory examination: Secondary | ICD-10-CM | POA: Insufficient documentation

## 2011-05-23 HISTORY — PX: LAPAROSCOPIC TUBAL LIGATION: SHX1937

## 2011-05-23 HISTORY — PX: ENDOMETRIAL ABLATION: SHX621

## 2011-05-23 HISTORY — PX: IUD REMOVAL: SHX5392

## 2011-05-23 SURGERY — LIGATION, FALLOPIAN TUBE, LAPAROSCOPIC
Anesthesia: General | Laterality: Bilateral

## 2011-05-23 MED ORDER — LIDOCAINE HCL (CARDIAC) 20 MG/ML IV SOLN
INTRAVENOUS | Status: AC
  Filled 2011-05-23: qty 5

## 2011-05-23 MED ORDER — ALBUTEROL SULFATE HFA 108 (90 BASE) MCG/ACT IN AERS
INHALATION_SPRAY | RESPIRATORY_TRACT | Status: DC | PRN
  Administered 2011-05-23: 2 via RESPIRATORY_TRACT

## 2011-05-23 MED ORDER — GLYCOPYRROLATE 0.2 MG/ML IJ SOLN
INTRAMUSCULAR | Status: DC | PRN
  Administered 2011-05-23: .4 mg via INTRAVENOUS

## 2011-05-23 MED ORDER — FENTANYL CITRATE 0.05 MG/ML IJ SOLN
INTRAMUSCULAR | Status: DC | PRN
  Administered 2011-05-23 (×2): 50 ug via INTRAVENOUS

## 2011-05-23 MED ORDER — GLYCOPYRROLATE 0.2 MG/ML IJ SOLN
INTRAMUSCULAR | Status: AC
  Filled 2011-05-23: qty 1

## 2011-05-23 MED ORDER — ONDANSETRON HCL 4 MG/2ML IJ SOLN
INTRAMUSCULAR | Status: DC | PRN
  Administered 2011-05-23: 4 mg via INTRAVENOUS

## 2011-05-23 MED ORDER — PROMETHAZINE HCL 25 MG/ML IJ SOLN: 6.2500 mg | INTRAMUSCULAR | Status: DC | PRN

## 2011-05-23 MED ORDER — KETOROLAC TROMETHAMINE 30 MG/ML IJ SOLN
INTRAMUSCULAR | Status: DC | PRN
  Administered 2011-05-23: 30 mg via INTRAVENOUS

## 2011-05-23 MED ORDER — MIDAZOLAM HCL 5 MG/5ML IJ SOLN
INTRAMUSCULAR | Status: DC | PRN
  Administered 2011-05-23: 2 mg via INTRAVENOUS

## 2011-05-23 MED ORDER — FENTANYL CITRATE 0.05 MG/ML IJ SOLN
INTRAMUSCULAR | Status: AC
  Administered 2011-05-23: 50 ug via INTRAVENOUS
  Filled 2011-05-23: qty 2

## 2011-05-23 MED ORDER — NEOSTIGMINE METHYLSULFATE 1 MG/ML IJ SOLN
INTRAMUSCULAR | Status: DC | PRN
  Administered 2011-05-23: 3 mg via INTRAVENOUS

## 2011-05-23 MED ORDER — ACETAMINOPHEN 325 MG PO TABS: 325.0000 mg | ORAL_TABLET | ORAL | Status: DC | PRN

## 2011-05-23 MED ORDER — PROPOFOL 10 MG/ML IV EMUL
INTRAVENOUS | Status: AC
  Filled 2011-05-23: qty 20

## 2011-05-23 MED ORDER — DEXAMETHASONE SODIUM PHOSPHATE 4 MG/ML IJ SOLN
INTRAMUSCULAR | Status: DC | PRN
  Administered 2011-05-23: 10 mg via INTRAVENOUS

## 2011-05-23 MED ORDER — ALBUTEROL SULFATE HFA 108 (90 BASE) MCG/ACT IN AERS
1.0000 | INHALATION_SPRAY | RESPIRATORY_TRACT | Status: DC
Start: 2011-05-23 — End: 2011-05-23

## 2011-05-23 MED ORDER — LACTATED RINGERS IV SOLN
INTRAVENOUS | Status: DC
  Administered 2011-05-23 (×2): via INTRAVENOUS

## 2011-05-23 MED ORDER — ROCURONIUM BROMIDE 100 MG/10ML IV SOLN
INTRAVENOUS | Status: DC | PRN
  Administered 2011-05-23: 30 mg via INTRAVENOUS

## 2011-05-23 MED ORDER — METOCLOPRAMIDE HCL 5 MG/ML IJ SOLN
10.0000 mg | Freq: Once | INTRAMUSCULAR | Status: AC | PRN
  Administered 2011-05-23: 10 mg via INTRAVENOUS

## 2011-05-23 MED ORDER — ONDANSETRON HCL 4 MG/2ML IJ SOLN
INTRAMUSCULAR | Status: AC
  Filled 2011-05-23: qty 2

## 2011-05-23 MED ORDER — PROPOFOL 10 MG/ML IV EMUL
INTRAVENOUS | Status: DC | PRN
  Administered 2011-05-23: 200 mg via INTRAVENOUS

## 2011-05-23 MED ORDER — KETOROLAC TROMETHAMINE 30 MG/ML IJ SOLN
INTRAMUSCULAR | Status: AC
  Filled 2011-05-23: qty 1

## 2011-05-23 MED ORDER — OXYCODONE-ACETAMINOPHEN 5-325 MG PO TABS: 1.0000 | ORAL_TABLET | ORAL | Status: AC | PRN

## 2011-05-23 MED ORDER — SODIUM CHLORIDE 0.9 % IJ SOLN
INTRAMUSCULAR | Status: DC | PRN
  Administered 2011-05-23: 10 mL via INTRAVENOUS

## 2011-05-23 MED ORDER — NEOSTIGMINE METHYLSULFATE 1 MG/ML IJ SOLN
INTRAMUSCULAR | Status: AC
  Filled 2011-05-23: qty 10

## 2011-05-23 MED ORDER — LIDOCAINE HCL (CARDIAC) 20 MG/ML IV SOLN
INTRAVENOUS | Status: DC | PRN
  Administered 2011-05-23: 100 mg via INTRAVENOUS

## 2011-05-23 MED ORDER — ALBUTEROL SULFATE HFA 108 (90 BASE) MCG/ACT IN AERS
INHALATION_SPRAY | RESPIRATORY_TRACT | Status: AC
  Filled 2011-05-23: qty 6.7

## 2011-05-23 MED ORDER — FENTANYL CITRATE 0.05 MG/ML IJ SOLN
INTRAMUSCULAR | Status: AC
  Filled 2011-05-23: qty 5

## 2011-05-23 MED ORDER — BUPIVACAINE HCL (PF) 0.25 % IJ SOLN
INTRAMUSCULAR | Status: DC | PRN
  Administered 2011-05-23: 5 mL

## 2011-05-23 MED ORDER — MIDAZOLAM HCL 2 MG/2ML IJ SOLN
INTRAMUSCULAR | Status: AC
  Filled 2011-05-23: qty 2

## 2011-05-23 MED ORDER — FENTANYL CITRATE 0.05 MG/ML IJ SOLN
25.0000 ug | INTRAMUSCULAR | Status: DC | PRN
  Administered 2011-05-23: 50 ug via INTRAVENOUS

## 2011-05-23 MED ORDER — KETOROLAC TROMETHAMINE 30 MG/ML IJ SOLN: 15.0000 mg | Freq: Once | INTRAMUSCULAR | Status: DC | PRN

## 2011-05-23 MED ORDER — ROCURONIUM BROMIDE 50 MG/5ML IV SOLN
INTRAVENOUS | Status: AC
  Filled 2011-05-23: qty 1

## 2011-05-23 MED ORDER — DEXAMETHASONE SODIUM PHOSPHATE 10 MG/ML IJ SOLN
INTRAMUSCULAR | Status: AC
  Filled 2011-05-23: qty 1

## 2011-05-23 MED ORDER — METOCLOPRAMIDE HCL 5 MG/ML IJ SOLN
INTRAMUSCULAR | Status: AC
  Administered 2011-05-23: 10 mg via INTRAVENOUS
  Filled 2011-05-23: qty 2

## 2011-05-23 SURGICAL SUPPLY — 18 items
CATH ROBINSON RED A/P 16FR (CATHETERS) ×3 IMPLANT
CHLORAPREP W/TINT 26ML (MISCELLANEOUS) ×3 IMPLANT
CLOTH BEACON ORANGE TIMEOUT ST (SAFETY) ×3 IMPLANT
DERMABOND ADVANCED (GAUZE/BANDAGES/DRESSINGS) ×1
DERMABOND ADVANCED .7 DNX12 (GAUZE/BANDAGES/DRESSINGS) ×2 IMPLANT
GLOVE BIO SURGEON STRL SZ8 (GLOVE) ×3 IMPLANT
GLOVE ORTHO TXT STRL SZ7.5 (GLOVE) ×3 IMPLANT
NEEDLE INSUFFLATION 14GA 120MM (NEEDLE) ×3 IMPLANT
PACK LAPAROSCOPY BASIN (CUSTOM PROCEDURE TRAY) ×3 IMPLANT
SUT VIC AB 3-0 CTX 36 (SUTURE) IMPLANT
SUT VIC AB 3-0 PS2 18 (SUTURE) ×1
SUT VIC AB 3-0 PS2 18XBRD (SUTURE) ×2 IMPLANT
SUT VICRYL 0 ENDOLOOP (SUTURE) IMPLANT
SUT VICRYL 0 UR6 27IN ABS (SUTURE) IMPLANT
TOWEL OR 17X24 6PK STRL BLUE (TOWEL DISPOSABLE) ×6 IMPLANT
TROCAR Z-THREAD FIOS 11X100 BL (TROCAR) ×3 IMPLANT
WARMER LAPAROSCOPE (MISCELLANEOUS) ×3 IMPLANT
WATER STERILE IRR 1000ML POUR (IV SOLUTION) ×3 IMPLANT

## 2011-05-23 NOTE — Anesthesia Procedure Notes (Signed)
Procedure Name: Intubation Performed by: Kamdon Reisig MARIE Pre-anesthesia Checklist: Patient identified, Patient being monitored, Emergency Drugs available, Timeout performed and Suction available Patient Re-evaluated:Patient Re-evaluated prior to inductionOxygen Delivery Method: Circle System Utilized Preoxygenation: Pre-oxygenation with 100% oxygen Intubation Type: IV induction Ventilation: Mask ventilation without difficulty Laryngoscope Size: Mac and 3 Grade View: Grade I Tube type: Oral Number of attempts: 1 Airway Equipment and Method: stylet Dental Injury: Teeth and Oropharynx as per pre-operative assessment

## 2011-05-23 NOTE — Anesthesia Postprocedure Evaluation (Signed)
Anesthesia Post Note  Patient: Emily Woodard  Procedure(s) Performed:  LAPAROSCOPIC TUBAL LIGATION; INTRAUTERINE DEVICE (IUD) REMOVAL; ENDOMETRIAL ABLATION  Anesthesia type: GA  Patient location: PACU  Post pain: Pain level controlled  Post assessment: Post-op Vital signs reviewed  Last Vitals:  Filed Vitals:   05/23/11 0815  BP: 103/64  Pulse: 58  Temp: 36.9 C  Resp: 16    Post vital signs: Reviewed  Level of consciousness: sedated  Complications: No apparent anesthesia complications

## 2011-05-23 NOTE — Anesthesia Preprocedure Evaluation (Signed)
Anesthesia Evaluation  Patient identified by MRN, date of birth, ID band Patient awake    Reviewed: Allergy & Precautions, H&P , Patient's Chart, lab work & pertinent test results, reviewed documented beta blocker date and time   History of Anesthesia Complications Negative for: history of anesthetic complications  Airway Mallampati: II TM Distance: >3 FB Neck ROM: full    Dental No notable dental hx.    Pulmonary neg pulmonary ROS, asthma , Recent URI , Resolved,  clear to auscultation  Pulmonary exam normal       Cardiovascular Exercise Tolerance: Good neg cardio ROS regular Normal    Neuro/Psych Seizures -,  Negative Neurological ROS  Negative Psych ROS   GI/Hepatic negative GI ROS, Neg liver ROS,   Endo/Other  Negative Endocrine ROS  Renal/GU negative Renal ROS     Musculoskeletal   Abdominal   Peds  Hematology negative hematology ROS (+)   Anesthesia Other Findings   Reproductive/Obstetrics negative OB ROS                           Anesthesia Physical Anesthesia Plan  ASA: II  Anesthesia Plan: General   Post-op Pain Management:    Induction:   Airway Management Planned:   Additional Equipment:   Intra-op Plan:   Post-operative Plan:   Informed Consent: I have reviewed the patients History and Physical, chart, labs and discussed the procedure including the risks, benefits and alternatives for the proposed anesthesia with the patient or authorized representative who has indicated his/her understanding and acceptance.   Dental Advisory Given  Plan Discussed with: CRNA and Surgeon  Anesthesia Plan Comments:         Anesthesia Quick Evaluation

## 2011-05-23 NOTE — H&P (Signed)
Emily Woodard is an 30 y.o. female, P68. She is being admitted for laparoscopic tubal, desires permanent sterility.  She is also having some pain in her right pelvis where she previously had an ectopic pregnancy treated with methotrexate.    Pertinent Gynecological History: Last pap: ASCUS with neg HPV Date: 03-2011 OB History: G4, P2022, 2 SVD at term, one SAB, one ectopic   Menstrual History: No LMP recorded.    Past Medical History  Diagnosis Date  . Abortion in first trimester 03/2000  . Depression   . Anxiety     hx panic disorder  . Asthma     no prob as adult - no inhaler  . Recurrent upper respiratory infection (URI)     05/14/2011 - tx with otc  . Seizures     last one 2002 - no meds  Ectopic pregnancy treated with methotrexate  Past Surgical History  Procedure Date  . Dilation and evacuation 12/2001  . Colposcopy   . Wisdom tooth extraction   . Svd  07/2004, 08/2009    x 2    History reviewed. No pertinent family history.  Social History:  reports that she quit smoking about 4 years ago. Her smoking use included Cigarettes. She has a 6 pack-year smoking history. She has never used smokeless tobacco. She reports that she uses illicit drugs (Heroin, Marijuana, and Other-see comments). She reports that she does not drink alcohol.  Allergies: No Known Allergies  Prescriptions prior to admission  Medication Sig Dispense Refill  . phenazopyridine (PYRIDIUM) 95 MG tablet Take 95 mg by mouth 3 (three) times daily as needed. For bladder discomfort.       Marland Kitchen ibuprofen (ADVIL,MOTRIN) 200 MG tablet Take 600 mg by mouth 2 (two) times a week. PRN pain.       Marland Kitchen levonorgestrel (MIRENA) 20 MCG/24HR IUD 1 each by Intrauterine route once.          Review of Systems  Respiratory: Negative.   Cardiovascular: Negative.   Gastrointestinal: Negative.   Genitourinary: Positive for frequency.    Blood pressure 103/64, pulse 58, temperature 98.4 F (36.9 C), temperature source  Oral, resp. rate 16, SpO2 99.00%. Physical Exam  Constitutional: She appears well-developed and well-nourished.  Neck: Neck supple. No thyromegaly present.  Cardiovascular: Normal rate, regular rhythm and normal heart sounds.   No murmur heard. Respiratory: Breath sounds normal. No respiratory distress.  GI: Soft. She exhibits no distension and no mass. There is no tenderness.  Genitourinary: Vagina normal and uterus normal.       No adnexal mass IUD strings appropriate    No results found for this or any previous visit (from the past 24 hour(s)).  No results found.  Assessment/Plan: Desired permanent sterility.  All options for contraception have been discussed.  Laparoscopic tubal procedure, risks, chance of failure have been discussed.  Also having some right pelvic pain, has IUD in place.  Will proceed with laparoscopic tubal, evaluate pelvis for source of right pelvic pain as well, remove IUD if tubal accomplished.    Bejamin Hackbart D 05/23/2011, 8:24 AM

## 2011-05-23 NOTE — Op Note (Signed)
Preoperative diagnosis: Desires surgical sterility, right pelvic pain Postoperative diagnosis: Desires surgical sterility, right pelvic pain, pelvic endometriosis Procedure: Laparoscopic bilateral tubal fulguration, fulguration of endometriosis, removal of IUD Surgeon: Lavina Hamman M.D. Anesthesia: Gen. Endotracheal tube Findings: She had an essentially normal pelvis with a normal uterus tubes and ovaries. There were implants on both uterosacral ligaments consistent with endometriosis. Her Mirena IUD was in proper position and was removed intact Estimated blood loss: Minimal Specimens: None Complications: None  Procedure in detail: The patient was taken to the operating room and placed in the dorsosupine position. Left arm was tucked to her side. General anesthesia was induced and legs were placed in mobile stirrups. Abdomen was then prepped and draped in usual sterile fashion, bladder drained with a red Robinson catheter, Hulka tenaculum applied to the cervix for uterine manipulation. Infraumbilical skin was then infiltrated with quarter percent Marcaine and a 1 cm vertical incision was made. The Veress needle was inserted into the peritoneal cavity and placement confirmed by the water drop test and an opening pressure of 3 mm mercury. CO2 was insufflated to a pressure of 12 mm of mercury and the Veress needle was removed. A 10 mm trocar was introduced with direct visualization with the laparoscope. Inspection revealed revealed the above-mentioned findings. She also had a normal appendix and normal upper abdomen. Again inspection of her posterior cul-de-sac revealed several implants of endometriosis. These were all well away from the ureter. These were all fulgurated with bipolar cautery. Both fallopian tubes were identified and traced to their fimbriated ends. The middle portion of each fallopian tube was fulgurated with bipolar cautery in 3 separate spots until the amp meter read 0 in all spots. This  appeared to achieve good fulguration of both tubes and of any visible endometriosis. No further source for her pain was identified. The laparoscope was removed and all gas was allowed to deflate from her abdomen. The umbilical trocar was removed. Skin incision was closed with interrupted subcuticular sutures of 4-0 Vicryl followed by Dermabond.  Attention was turned vaginally. Her legs were elevated in stirrups and a Graves speculum was inserted in the vagina. The Hulka tenaculum was removed. Bleeding from the puncture site was controlled with pressure. The Mirena IUD was grasped with a ring forceps and removed easily intact without difficulty. No further significant bleeding was noted. A Graves speculum was removed. She was taken down from stirrups. She is awakened in the operating room and taken to the recovery in stable condition after tolerating the procedure well. Counts were correct x2 and she had PAS hose on throughout the procedure.

## 2011-05-23 NOTE — Transfer of Care (Signed)
Immediate Anesthesia Transfer of Care Note  Patient: Emily Woodard  Procedure(s) Performed:  LAPAROSCOPIC TUBAL LIGATION; INTRAUTERINE DEVICE (IUD) REMOVAL; ENDOMETRIAL ABLATION  Patient Location: PACU  Anesthesia Type: General  Level of Consciousness: alert   Airway & Oxygen Therapy: Patient Spontanous Breathing and Patient connected to nasal cannula oxygen  Post-op Assessment: Report given to PACU RN and Post -op Vital signs reviewed and stable  Post vital signs: Reviewed and stable  Complications: No apparent anesthesia complications

## 2011-05-23 NOTE — Preoperative (Signed)
Beta Blockers   Reason not to administer Beta Blockers:Not Applicable 

## 2011-05-23 NOTE — Progress Notes (Signed)
Date of Initial H&P: 05-23-11  History reviewed, patient examined, no change in status, stable for surgery.

## 2011-05-24 ENCOUNTER — Encounter (HOSPITAL_COMMUNITY): Payer: Self-pay | Admitting: Obstetrics and Gynecology

## 2011-12-07 ENCOUNTER — Emergency Department (HOSPITAL_COMMUNITY)
Admission: EM | Admit: 2011-12-07 | Discharge: 2011-12-07 | Disposition: A | Discharge status: Home / Self Care | Payer: Medicaid Other | Attending: Emergency Medicine | Admitting: Emergency Medicine

## 2011-12-07 ENCOUNTER — Encounter (HOSPITAL_COMMUNITY): Payer: Self-pay | Admitting: Emergency Medicine

## 2011-12-07 DIAGNOSIS — Z87891 Personal history of nicotine dependence: Secondary | ICD-10-CM | POA: Insufficient documentation

## 2011-12-07 DIAGNOSIS — N390 Urinary tract infection, site not specified: Secondary | ICD-10-CM | POA: Insufficient documentation

## 2011-12-07 LAB — URINALYSIS, ROUTINE W REFLEX MICROSCOPIC
Glucose, UA: NEGATIVE mg/dL
Ketones, ur: NEGATIVE mg/dL
Protein, ur: NEGATIVE mg/dL
Urobilinogen, UA: 1 mg/dL (ref 0.0–1.0)

## 2011-12-07 LAB — URINE MICROSCOPIC-ADD ON

## 2011-12-07 MED ORDER — NITROFURANTOIN MONOHYD MACRO 100 MG PO CAPS: 100.0000 mg | ORAL_CAPSULE | Freq: Two times a day (BID) | ORAL | Status: AC

## 2011-12-07 NOTE — Discharge Instructions (Signed)
Please read and follow instructions below.  Your urine test showed that you have an infection in your urine.  Take antibiotics as prescribed.   If you do not have improvement in 3 days, please return or see your doctor for a recheck.  Return sooner with fever or other concerns.

## 2011-12-07 NOTE — ED Provider Notes (Signed)
History     CSN: [id number]  Arrival date & time 12/07/11  0941   First MD Initiated Contact with Patient 12/07/11 [phone]      Chief Complaint  Patient presents with  . Back Pain  . Headache  . Urinary Urgency    denies pain on urination, using pyridium    (Consider location/radiation/quality/duration/timing/severity/associated sxs/prior treatment) HPI Comments: Patient presents with history of urinary frequency and urgency for 2 to 3 days. She does not have dysuria. This is typical for her previous UTIs. She denies fever, nausea or vomiting. She's been using Azo with moderate relief. Nothing makes symptoms worse. She has had mild suprapubic tenderness. Course is constant. Onset was gradual.  Patient is a 31 y.o. female presenting with dysuria. The history is provided by the patient.  Dysuria  This is a new problem. The current episode started more than 2 days ago. The problem occurs intermittently. The problem has not changed since onset.The patient is experiencing no pain. There has been no fever. There is no history of pyelonephritis. Associated symptoms include frequency and urgency. Pertinent negatives include no nausea, no vomiting, no hematuria, no hesitancy and no flank pain. Her past medical history is significant for recurrent UTIs.    Past Medical History  Diagnosis Date  . Abortion in first trimester 03/2000  . Depression   . Anxiety     hx panic disorder  . Asthma     no prob as adult - no inhaler  . Recurrent upper respiratory infection (URI)     05/14/2011 - tx with otc  . Seizures     last one 2002 - no meds    Past Surgical History  Procedure Date  . Dilation and evacuation 12/2001  . Colposcopy   . Wisdom tooth extraction   . Svd  07/2004, 08/2009    x 2  . Laparoscopic tubal ligation 05/23/2011    Procedure: LAPAROSCOPIC TUBAL LIGATION;  Surgeon: Zenaida Niece, MD;  Location: [facility];  Service: Gynecology;  Laterality: Bilateral;  . Iud removal  05/23/2011    Procedure: INTRAUTERINE DEVICE (IUD) REMOVAL;  Surgeon: Zenaida Niece, MD;  Location: [facility];  Service: Gynecology;;  . Endometrial ablation 05/23/2011    Procedure: ENDOMETRIAL ABLATION;  Surgeon: Zenaida Niece, MD;  Location: [facility];  Service: Gynecology;;    Family History  Problem Relation Age of Onset  . Diabetes Mother   . Hyperlipidemia Mother   . Hypertension Mother     History  Substance Use Topics  . Smoking status: Former Smoker -- 1.0 packs/day for 6 years    Types: Cigarettes    Quit date: 06/26/2006  . Smokeless tobacco: Never Used  . Alcohol Use: No    OB History    Grav Para Term Preterm Abortions TAB SAB Ect Mult Living                  Review of Systems  Constitutional: Negative for fever.  HENT: Negative for sore throat and rhinorrhea.   Eyes: Negative for redness.  Respiratory: Negative for cough.   Cardiovascular: Negative for chest pain.  Gastrointestinal: Negative for nausea, vomiting, abdominal pain and diarrhea.  Genitourinary: Positive for urgency and frequency. Negative for dysuria, hesitancy, hematuria, flank pain, vaginal bleeding and vaginal discharge.  Musculoskeletal: Negative for myalgias.  Skin: Negative for rash.  Neurological: Negative for headaches.    Allergies  Review of patient's allergies indicates no known allergies.  Home Medications   Current  Outpatient Rx  Name Route Sig Dispense Refill  . CITALOPRAM HYDROBROMIDE 20 MG PO TABS Oral Take 20 mg by mouth daily.    . IBUPROFEN 200 MG PO TABS Oral Take 600 mg by mouth every 6 (six) hours as needed. Pain    . PHENAZOPYRIDINE HCL 97.2 MG PO TABS Oral Take 97 mg by mouth 3 (three) times daily as needed.    Marland Kitchen VALACYCLOVIR HCL 500 MG PO TABS Oral Take 500 mg by mouth daily.      BP 109/67  Pulse 57  Temp 98.4 F (36.9 C) (Oral)  SpO2 97%  LMP 12/03/2011  Physical Exam  Nursing note and vitals reviewed. Constitutional: She appears well-developed and  well-nourished.  HENT:  Head: Normocephalic and atraumatic.  Mouth/Throat: Oropharynx is clear and moist.  Eyes: Conjunctivae are normal.  Neck: Normal range of motion. Neck supple.  Pulmonary/Chest: No respiratory distress.  Abdominal: Soft. Bowel sounds are normal. There is tenderness (mild) in the suprapubic area. There is no rigidity, no rebound, no guarding, no tenderness at McBurney's point and negative Murphy's sign.  Neurological: She is alert.  Skin: Skin is warm and dry.  Psychiatric: She has a normal mood and affect.    ED Course  Procedures (including critical care time)  Labs Reviewed  URINALYSIS, ROUTINE W REFLEX MICROSCOPIC - Abnormal; Notable for the following:    Color, Urine ORANGE (*)  BIOCHEMICALS MAY BE AFFECTED BY COLOR   APPearance CLOUDY (*)     Nitrite POSITIVE (*)     Leukocytes, UA SMALL (*)     All other components within normal limits  URINE MICROSCOPIC-ADD ON - Abnormal; Notable for the following:    Squamous Epithelial / LPF MANY (*)     Bacteria, UA MANY (*)     All other components within normal limits   No results found.   1. Urinary tract infection     11:40 AM Patient seen and examined. Work-up initiated.   Vital signs reviewed and are as follows: Filed Vitals:   12/07/11 1204  BP: 97/70  Pulse:   Temp:    Will treat for UTI. Patient counseled to return with worsening symptoms, fever, persistent vomiting, other concerns. Patient verbalizes understanding and agrees with plan.   MDM  Nitrite positive UTI, do not suspect pyelonephritis. Patient is tolerating PO's and is able to take her medications.        Renne Crigler, [place] 12/07/11 1616  [place], [place] 12/07/11 [phone]

## 2011-12-07 NOTE — ED Provider Notes (Signed)
Medical screening examination/treatment/procedure(s) were performed by non-physician practitioner and as supervising physician I was immediately available for consultation/collaboration.   Dayton Bailiff, MD 12/07/11 [phone]

## 2011-12-07 NOTE — ED Notes (Signed)
Pt report as two day hx of frequency and urgency with urination. Denies pain. C/o headache and back pain

## 2012-02-26 ENCOUNTER — Emergency Department (HOSPITAL_COMMUNITY): Payer: Medicaid Other

## 2012-02-26 ENCOUNTER — Emergency Department (HOSPITAL_COMMUNITY)
Admission: EM | Admit: 2012-02-26 | Discharge: 2012-02-26 | Disposition: A | Discharge status: Home / Self Care | Payer: Medicaid Other | Attending: Emergency Medicine | Admitting: Emergency Medicine

## 2012-02-26 ENCOUNTER — Encounter (HOSPITAL_COMMUNITY): Payer: Self-pay | Admitting: Emergency Medicine

## 2012-02-26 DIAGNOSIS — M545 Low back pain, unspecified: Secondary | ICD-10-CM | POA: Insufficient documentation

## 2012-02-26 DIAGNOSIS — J45909 Unspecified asthma, uncomplicated: Secondary | ICD-10-CM | POA: Insufficient documentation

## 2012-02-26 DIAGNOSIS — S335XXA Sprain of ligaments of lumbar spine, initial encounter: Secondary | ICD-10-CM | POA: Insufficient documentation

## 2012-02-26 DIAGNOSIS — F3289 Other specified depressive episodes: Secondary | ICD-10-CM | POA: Insufficient documentation

## 2012-02-26 DIAGNOSIS — F329 Major depressive disorder, single episode, unspecified: Secondary | ICD-10-CM | POA: Insufficient documentation

## 2012-02-26 DIAGNOSIS — F411 Generalized anxiety disorder: Secondary | ICD-10-CM | POA: Insufficient documentation

## 2012-02-26 DIAGNOSIS — X500XXA Overexertion from strenuous movement or load, initial encounter: Secondary | ICD-10-CM | POA: Insufficient documentation

## 2012-02-26 DIAGNOSIS — G40909 Epilepsy, unspecified, not intractable, without status epilepticus: Secondary | ICD-10-CM | POA: Insufficient documentation

## 2012-02-26 DIAGNOSIS — Z79899 Other long term (current) drug therapy: Secondary | ICD-10-CM | POA: Insufficient documentation

## 2012-02-26 DIAGNOSIS — R55 Syncope and collapse: Secondary | ICD-10-CM

## 2012-02-26 DIAGNOSIS — S39012A Strain of muscle, fascia and tendon of lower back, initial encounter: Secondary | ICD-10-CM

## 2012-02-26 LAB — URINALYSIS, ROUTINE W REFLEX MICROSCOPIC
Bilirubin Urine: NEGATIVE
Hgb urine dipstick: NEGATIVE
Nitrite: NEGATIVE
Specific Gravity, Urine: 1.026 (ref 1.005–1.030)
pH: 7 (ref 5.0–8.0)

## 2012-02-26 LAB — BASIC METABOLIC PANEL
BUN: 14 mg/dL (ref 6–23)
CO2: 24 mEq/L (ref 19–32)
Chloride: 103 mEq/L (ref 96–112)
GFR calc Af Amer: >90 mL/min (ref >90)
Glucose, Bld: 108 mg/dL — ABNORMAL HIGH (ref 70–99)
Potassium: 3.9 mEq/L (ref 3.5–5.1)

## 2012-02-26 LAB — CBC
HCT: 36.4 % (ref 36.0–46.0)
Hemoglobin: 13 g/dL (ref 12.0–15.0)
RBC: 4.04 MIL/uL (ref 3.87–5.11)
WBC: 13.6 10*3/uL — ABNORMAL HIGH (ref 4.0–10.5)

## 2012-02-26 MED ORDER — MORPHINE SULFATE 4 MG/ML IJ SOLN
4.0000 mg | INTRAMUSCULAR | Status: DC | PRN
  Filled 2012-02-26: qty 1

## 2012-02-26 MED ORDER — SODIUM CHLORIDE 0.9 % IV SOLN
1000.0000 mL | Freq: Once | INTRAVENOUS | Status: AC
  Administered 2012-02-26: 1000 mL via INTRAVENOUS

## 2012-02-26 MED ORDER — ONDANSETRON HCL 4 MG/2ML IJ SOLN
4.0000 mg | Freq: Once | INTRAMUSCULAR | Status: DC | PRN
  Filled 2012-02-26: qty 2

## 2012-02-26 MED ORDER — CYCLOBENZAPRINE HCL 10 MG PO TABS: 10.0000 mg | ORAL_TABLET | Freq: Two times a day (BID) | ORAL | Status: AC | PRN

## 2012-02-26 MED ORDER — ONDANSETRON HCL 4 MG/2ML IJ SOLN
INTRAMUSCULAR | Status: AC
  Administered 2012-02-26: 19:00:00
  Filled 2012-02-26: qty 2

## 2012-02-26 MED ORDER — IBUPROFEN 200 MG PO TABS
400.0000 mg | ORAL_TABLET | Freq: Once | ORAL | Status: AC
  Administered 2012-02-26: 400 mg via ORAL
  Filled 2012-02-26: qty 2

## 2012-02-26 MED ORDER — HYDROCODONE-ACETAMINOPHEN 5-325 MG PO TABS
1.0000 | ORAL_TABLET | Freq: Once | ORAL | Status: AC
  Administered 2012-02-26: 1 via ORAL
  Filled 2012-02-26: qty 1

## 2012-02-26 MED ORDER — HYDROCODONE-ACETAMINOPHEN 5-325 MG PO TABS: 1.0000 | ORAL_TABLET | Freq: Four times a day (QID) | ORAL | Status: AC | PRN

## 2012-02-26 MED ORDER — NAPROXEN 500 MG PO TABS: 500.0000 mg | ORAL_TABLET | Freq: Two times a day (BID) | ORAL | Status: DC

## 2012-02-26 MED ORDER — ONDANSETRON 8 MG PO TBDP: 8.0000 mg | ORAL_TABLET | Freq: Three times a day (TID) | ORAL | Status: AC | PRN

## 2012-02-26 MED ORDER — SODIUM CHLORIDE 0.9 % IV SOLN: 1000.0000 mL | INTRAVENOUS | Status: DC

## 2012-02-26 NOTE — ED Notes (Signed)
Knapp, MD at bedside.  

## 2012-02-26 NOTE — ED Notes (Signed)
Bed:WA11<BR> Expected date:<BR> Expected time:<BR> Means of arrival:<BR> Comments:<BR> Back pain

## 2012-02-26 NOTE — ED Provider Notes (Signed)
History     CSN: [id number] Arrival date & time 02/26/12  1851 First MD Initiated Contact with Patient 02/26/12 1938      Chief Complaint  Patient presents with  . Back Pain    HPI The patient presents to the emergency room with complaints of back pain and syncope. The patient states she was helping her grandmother gave when she felt a sharp pop in her lower back. Patient had to grab the radiologist herself to the floor. Shortly thereafter however she became very lightheaded and she had a syncopal event. Patient states she felt like she was going to have a seizure which she apparently had once when she was a child. Apparently they never figured out why she has had seizures and she has not had one since 2002 and has not taken medications. Patient continues to have pain in her lower back and it sharp and increases with movement. She denies any chest pain or shortness of breath. She denies any vomiting or diarrhea. Past Medical History  Diagnosis Date  . Abortion in first trimester 03/2000  . Depression   . Anxiety     hx panic disorder  . Asthma     no prob as adult - no inhaler  . Recurrent upper respiratory infection (URI)     05/14/2011 - tx with otc  . Seizures     last one 2002 - no meds    Past Surgical History  Procedure Date  . Dilation and evacuation 12/2001  . Colposcopy   . Wisdom tooth extraction   . Svd  07/2004, 08/2009    x 2  . Laparoscopic tubal ligation 05/23/2011    Procedure: LAPAROSCOPIC TUBAL LIGATION;  Surgeon: Zenaida Niece, MD;  Location: [facility];  Service: Gynecology;  Laterality: Bilateral;  . Iud removal 05/23/2011    Procedure: INTRAUTERINE DEVICE (IUD) REMOVAL;  Surgeon: Zenaida Niece, MD;  Location: [facility];  Service: Gynecology;;  . Endometrial ablation 05/23/2011    Procedure: ENDOMETRIAL ABLATION;  Surgeon: Zenaida Niece, MD;  Location: [facility];  Service: Gynecology;;    Family History  Problem Relation Age of Onset  . Diabetes Mother   .  Hyperlipidemia Mother   . Hypertension Mother     History  Substance Use Topics  . Smoking status: Former Smoker -- 1.0 packs/day for 6 years    Types: Cigarettes    Quit date: 06/26/2006  . Smokeless tobacco: Never Used  . Alcohol Use: No    OB History    Grav Para Term Preterm Abortions TAB SAB Ect Mult Living                  Review of Systems  Constitutional: Negative for fever.  HENT: Negative for neck pain and neck stiffness.   Neurological: Negative for headaches.  All other systems reviewed and are negative.    Allergies  Review of patient's allergies indicates no known allergies.  Home Medications   Current Outpatient Rx  Name Route Sig Dispense Refill  . CITALOPRAM HYDROBROMIDE 20 MG PO TABS Oral Take 20 mg by mouth daily.    Marland Kitchen DOXYLAMINE SUCCINATE (SLEEP) 25 MG PO TABS Oral Take 25 mg by mouth at bedtime as needed.    . IBUPROFEN 200 MG PO TABS Oral Take 600 mg by mouth every 6 (six) hours as needed. Pain    . ADULT MULTIVITAMIN W/MINERALS CH Oral Take 1 tablet by mouth daily.      BP 111/65  Pulse 88  Temp 97.4 F (36.3 C) (Oral)  SpO2 100%  LMP 02/18/2012  Physical Exam  Nursing note and vitals reviewed. Constitutional: She appears well-developed and well-nourished. No distress.  HENT:  Head: Normocephalic and atraumatic.  Right Ear: External ear normal.  Left Ear: External ear normal.  Nose: Nose normal.  Eyes: Conjunctivae and EOM are normal. Right eye exhibits no discharge. Left eye exhibits no discharge. No scleral icterus.  Neck: Neck supple. No tracheal deviation present.  Cardiovascular: Normal rate, regular rhythm and intact distal pulses.   Pulmonary/Chest: Effort normal and breath sounds normal. No stridor. No respiratory distress. She has no wheezes. She has no rales.  Abdominal: Soft. Bowel sounds are normal. She exhibits no distension. There is no tenderness. There is no rebound and no guarding.  Musculoskeletal: She exhibits no  edema and no tenderness.       Lumbar back: She exhibits decreased range of motion, tenderness, pain and spasm. She exhibits no swelling and no edema.  Neurological: She is alert. She has normal strength. She is not disoriented. No sensory deficit. Cranial nerve deficit:  no gross defecits noted. She exhibits normal muscle tone. She displays no seizure activity. Coordination normal.  Reflex Scores:      Patellar reflexes are 2+ on the right side and 2+ on the left side.      Achilles reflexes are 2+ on the right side and 2+ on the left side. Skin: Skin is warm and dry. No rash noted. She is not diaphoretic. No erythema.  Psychiatric: She has a normal mood and affect. Her behavior is normal. Thought content normal.    ED Course  Procedures (including critical care time)  Rate: 78  Rhythm: normal sinus rhythm  QRS Axis: normal  Intervals: normal  ST/T Wave abnormalities: normal  Conduction Disutrbances:none  Narrative Interpretation: limb lead reversal  Old EKG Reviewed: limb lead reversal compared to prior  Labs Reviewed  CBC - Abnormal; Notable for the following:    WBC 13.6 (*)     All other components within normal limits  BASIC METABOLIC PANEL - Abnormal; Notable for the following:    Glucose, Bld 108 (*)     All other components within normal limits  URINALYSIS, ROUTINE W REFLEX MICROSCOPIC   Dg Lumbar Spine Complete  02/26/2012  *RADIOLOGY REPORT*  Clinical Data: 31 year old female with low back pain, twisting injury.  Pain greater on the right.  LUMBAR SPINE - COMPLETE 4+ VIEW  Comparison: CT abdomen and pelvis 03/26/2011.  Findings: Normal lumbar segmentation, hypoplastic ribs at T12. Stable and normal vertebral height and alignment.  Stable and relatively preserved disc spaces.  There is asymmetric sclerosis of the right L5 pars, but no definite pars fracture.  No spondylolisthesis.  Sacral ala and SI joints are within normal limits.  Visualized lower ribs appear intact.   IMPRESSION: No acute fracture or listhesis identified in the lumbar spine.   Original Report Authenticated By: Harley Hallmark, M.D.      1. Lumbar strain   2. Syncope       MDM  Pt most likely had a syncopal episode associated with her back pain that sounds musculoskeletal in nature.  It is possible she had a seizure considering her history.  Will have her follow up as an outpatient with a neurologist.  She denies abdominal pain.  She has no acute neurologic dysfunction.  Will dc home with medications for her back pain.  Celene Kras, MD 02/26/12 2250

## 2012-02-26 NOTE — ED Notes (Signed)
Family called EMS, pt was bathing her grandmother and felt "pop" in her lower back, grabbed rail and lowered herself to the floor. Pt alert and responsive and appropriate upon arrival of EMS, #20 left forearm, one dose of IV Zofran 4 mg for nausea. CBG 90, BP-106/60, P- 89. Nausea is relieved. Pt endorses hx of seizures, but has not had one since she was 31 yrs old.

## 2012-02-26 NOTE — ED Notes (Signed)
Pt refused morphine. Pt transported to radiology via stretcher with tech

## 2012-09-15 ENCOUNTER — Encounter (HOSPITAL_COMMUNITY): Payer: Self-pay | Admitting: *Deleted

## 2012-09-15 ENCOUNTER — Emergency Department (HOSPITAL_COMMUNITY): Payer: Managed Care, Other (non HMO)

## 2012-09-15 ENCOUNTER — Emergency Department (HOSPITAL_COMMUNITY)
Admission: EM | Admit: 2012-09-15 | Discharge: 2012-09-15 | Disposition: A | Discharge status: Home / Self Care | Payer: Managed Care, Other (non HMO) | Attending: Emergency Medicine | Admitting: Emergency Medicine

## 2012-09-15 DIAGNOSIS — Z87891 Personal history of nicotine dependence: Secondary | ICD-10-CM | POA: Insufficient documentation

## 2012-09-15 DIAGNOSIS — IMO0001 Reserved for inherently not codable concepts without codable children: Secondary | ICD-10-CM | POA: Insufficient documentation

## 2012-09-15 DIAGNOSIS — Z8709 Personal history of other diseases of the respiratory system: Secondary | ICD-10-CM | POA: Insufficient documentation

## 2012-09-15 DIAGNOSIS — M549 Dorsalgia, unspecified: Secondary | ICD-10-CM | POA: Insufficient documentation

## 2012-09-15 DIAGNOSIS — G40909 Epilepsy, unspecified, not intractable, without status epilepticus: Secondary | ICD-10-CM | POA: Insufficient documentation

## 2012-09-15 DIAGNOSIS — Z9851 Tubal ligation status: Secondary | ICD-10-CM | POA: Insufficient documentation

## 2012-09-15 DIAGNOSIS — Z8659 Personal history of other mental and behavioral disorders: Secondary | ICD-10-CM | POA: Insufficient documentation

## 2012-09-15 DIAGNOSIS — Z79899 Other long term (current) drug therapy: Secondary | ICD-10-CM | POA: Insufficient documentation

## 2012-09-15 DIAGNOSIS — F411 Generalized anxiety disorder: Secondary | ICD-10-CM | POA: Insufficient documentation

## 2012-09-15 DIAGNOSIS — K5900 Constipation, unspecified: Secondary | ICD-10-CM | POA: Insufficient documentation

## 2012-09-15 HISTORY — DX: Fibromyalgia: M79.7

## 2012-09-15 LAB — COMPREHENSIVE METABOLIC PANEL
ALT: 12 U/L (ref 0–35)
Alkaline Phosphatase: 44 U/L (ref 39–117)
CO2: 25 mEq/L (ref 19–32)
Calcium: 9.2 mg/dL (ref 8.4–10.5)
Chloride: 103 mEq/L (ref 96–112)
GFR calc Af Amer: >90 mL/min (ref >90)
GFR calc non Af Amer: >90 mL/min (ref >90)
Glucose, Bld: 93 mg/dL (ref 70–99)
Potassium: 4 mEq/L (ref 3.5–5.1)
Sodium: 137 mEq/L (ref 135–145)
Total Bilirubin: 0.7 mg/dL (ref 0.3–1.2)

## 2012-09-15 LAB — CBC WITH DIFFERENTIAL/PLATELET
Eosinophils Relative: 3 % (ref 0–5)
Lymphocytes Relative: 26 % (ref 12–46)
Lymphs Abs: 2.1 10*3/uL (ref 0.7–4.0)
MCV: 88.7 fL (ref 78.0–100.0)
Platelets: 334 10*3/uL (ref 150–400)
RBC: 4.41 MIL/uL (ref 3.87–5.11)
WBC: 8.1 10*3/uL (ref 4.0–10.5)

## 2012-09-15 MED ORDER — IOHEXOL 300 MG/ML  SOLN
50.0000 mL | Freq: Once | INTRAMUSCULAR | Status: AC | PRN
  Administered 2012-09-15: 50 mL via ORAL

## 2012-09-15 MED ORDER — OXYCODONE-ACETAMINOPHEN 5-325 MG PO TABS
2.0000 | ORAL_TABLET | Freq: Once | ORAL | Status: AC
  Administered 2012-09-15: 2 via ORAL
  Filled 2012-09-15: qty 2

## 2012-09-15 MED ORDER — POLYETHYLENE GLYCOL 3350 17 GM/SCOOP PO POWD: 17.0000 g | Freq: Two times a day (BID) | ORAL | Status: DC

## 2012-09-15 NOTE — ED Provider Notes (Signed)
History     CSN: [id number]  Arrival date & time 09/15/12  1208   First MD Initiated Contact with Patient 09/15/12 1226      Chief Complaint  Patient presents with  . Abdominal Pain    (Consider location/radiation/quality/duration/timing/severity/associated sxs/prior treatment) HPI Comments: 32 y.o. Female with PMHx of fibromyalgia presents complaining of upper right abdominal pain since a few hours ago. Pt states that she awoke from a nap and felt "a knot" in the right side of her abdomen. Pt states pain is burning, severe, constant, and radiates laterally and medially. Nothing makes it better or worse. Pt states that she takes tramadol 50 mg BID for her fibromyalgia but that it no longer helps with any of her pain.   Denies fever, chest pain, shortness of breath, nausea, vomiting, dizziness.   Pt has tubal ligation.   Patient is a 32 y.o. female presenting with abdominal pain.  Abdominal Pain Associated symptoms: no chest pain, no constipation, no diarrhea, no dysuria, no fever, no nausea, no shortness of breath and no vomiting     Past Medical History  Diagnosis Date  . Abortion in first trimester 03/2000  . Depression   . Anxiety     hx panic disorder  . Asthma     no prob as adult - no inhaler  . Recurrent upper respiratory infection (URI)     05/14/2011 - tx with otc  . Seizures     last one 2002 - no meds  . Fibromyalgia     Past Surgical History  Procedure Laterality Date  . Dilation and evacuation  12/2001  . Colposcopy    . Wisdom tooth extraction    . Svd   07/2004, 08/2009    x 2  . Laparoscopic tubal ligation  05/23/2011    Procedure: LAPAROSCOPIC TUBAL LIGATION;  Surgeon: Zenaida Niece, MD;  Location: [facility];  Service: Gynecology;  Laterality: Bilateral;  . Iud removal  05/23/2011    Procedure: INTRAUTERINE DEVICE (IUD) REMOVAL;  Surgeon: Zenaida Niece, MD;  Location: [facility];  Service: Gynecology;;  . Endometrial ablation  05/23/2011    Procedure:  ENDOMETRIAL ABLATION;  Surgeon: Zenaida Niece, MD;  Location: [facility];  Service: Gynecology;;    Family History  Problem Relation Age of Onset  . Diabetes Mother   . Hyperlipidemia Mother   . Hypertension Mother     History  Substance Use Topics  . Smoking status: Former Smoker -- 1.00 packs/day for 6 years    Types: Cigarettes    Quit date: 06/26/2006  . Smokeless tobacco: Never Used  . Alcohol Use: No    OB History   Grav Para Term Preterm Abortions TAB SAB Ect Mult Living                  Review of Systems  Constitutional: Negative for fever and diaphoresis.  HENT: Negative for neck pain and neck stiffness.   Eyes: Negative for photophobia and visual disturbance.  Respiratory: Negative for chest tightness and shortness of breath.   Cardiovascular: Negative for chest pain and palpitations.  Gastrointestinal: Positive for abdominal pain. Negative for nausea, vomiting, diarrhea and constipation.       Upper right radiating laterally and medially  Genitourinary: Negative for dysuria and decreased urine volume.  Musculoskeletal: Positive for back pain. Negative for gait problem.       Pt has chronic back pain d/t fibromyalgia  Neurological: Negative for dizziness, weakness, light-headedness, numbness and  headaches.    Allergies  Review of patient's allergies indicates no known allergies.  Home Medications   Current Outpatient Rx  Name  Route  Sig  Dispense  Refill  . clonazePAM (KLONOPIN) 0.5 MG tablet   Oral   Take 0.25 mg by mouth daily as needed for anxiety.         Marland Kitchen doxylamine, Sleep, (UNISOM) 25 MG tablet   Oral   Take 25 mg by mouth at bedtime as needed.         Marland Kitchen ibuprofen (ADVIL,MOTRIN) 800 MG tablet   Oral   Take 800 mg by mouth every 8 (eight) hours as needed for pain.         . Milnacipran (SAVELLA) 50 MG TABS   Oral   Take 50 mg by mouth 2 (two) times daily.         . traMADol (ULTRAM) 50 MG tablet   Oral   Take 50 mg by mouth every  6 (six) hours as needed for pain.           BP 113/78  Pulse 91  Temp(Src) 98 F (36.7 C) (Oral)  Resp 18  SpO2 100%  LMP 08/26/2012  Physical Exam  Nursing note and vitals reviewed. Constitutional: She is oriented to person, place, and time. She appears well-developed and well-nourished. No distress.  HENT:  Head: Normocephalic and atraumatic.  Eyes: Conjunctivae and EOM are normal.  Neck: Normal range of motion. Neck supple.  No meningeal signs  Cardiovascular: Normal rate, regular rhythm and normal heart sounds.  Exam reveals no gallop and no friction rub.   No murmur heard. Pulmonary/Chest: Effort normal and breath sounds normal. No respiratory distress. She has no wheezes. She has no rales. She exhibits no tenderness.  Abdominal: Soft. Bowel sounds are normal. She exhibits no distension. There is tenderness. There is no rebound and no guarding.    Tenderness to right upper quadrant, more central. No pain at McBurney's point.  No murphy sign. unimpressive  Musculoskeletal: Normal range of motion. She exhibits no edema and no tenderness.  Strength 5/5 throughout. Good grip strength  Neurological: She is alert and oriented to person, place, and time. No cranial nerve deficit.  No focal deficits  Skin: Skin is warm and dry. She is not diaphoretic. No erythema.    ED Course  Procedures (including critical care time)  Labs Reviewed  CBC WITH DIFFERENTIAL  COMPREHENSIVE METABOLIC PANEL  LIPASE, BLOOD   No results found.   No diagnosis found.    MDM  Patient is nontoxic, nonseptic appearing, in no apparent distress. Unimpressive abdominal exam. Tenderness mid-abdominal more than in the liver/gallbladder region. Basic labs and CT of abdomen and re-evaluate.  Patient's pain and other symptoms adequately managed in emergency department.  Fluid bolus given.  Labs, imaging and vitals reviewed.  Patient does not meet the SIRS or Sepsis criteria.  On repeat exam patient  does not have a surgical abdomen and there are nor peritoneal signs.  No indication of appendicitis, bowel obstruction, bowel perforation, cholecystitis, diverticulitis, PID or ectopic pregnancy.  Patient discharged home with symptomatic treatment and given strict instructions for follow-up with their primary care physician.  I have also discussed reasons to return immediately to the ER.  Patient expresses understanding and agrees with plan.         Glade Nurse, PA-C 09/15/12 2157

## 2012-09-15 NOTE — ED Notes (Signed)
Pt states she has a hx of fibromyalgia, yesterday was hurting in neck, back, butt, and legs, then today woke up feeling lethargic and felt a knot on the R side of her stomach, states it's a burning pain, pt states was started on tramadol 50mg  twice a day and is not helping. Pt states it's hard for her to tell if it's another medical problem besides her fibromyalgia.

## 2012-09-17 NOTE — ED Provider Notes (Signed)
Medical screening examination/treatment/procedure(s) were conducted as a shared visit with non-physician practitioner(s) and myself.  I personally evaluated the patient during the encounter   Rolan Bucco, MD 09/17/12 2308

## 2012-10-13 ENCOUNTER — Encounter (HOSPITAL_COMMUNITY): Payer: Self-pay | Admitting: *Deleted

## 2012-10-13 ENCOUNTER — Emergency Department (HOSPITAL_COMMUNITY)
Admission: EM | Admit: 2012-10-13 | Discharge: 2012-10-13 | Disposition: A | Discharge status: Home / Self Care | Payer: Managed Care, Other (non HMO) | Attending: Emergency Medicine | Admitting: Emergency Medicine

## 2012-10-13 ENCOUNTER — Emergency Department (HOSPITAL_COMMUNITY): Payer: Managed Care, Other (non HMO)

## 2012-10-13 DIAGNOSIS — Z87891 Personal history of nicotine dependence: Secondary | ICD-10-CM | POA: Insufficient documentation

## 2012-10-13 DIAGNOSIS — F411 Generalized anxiety disorder: Secondary | ICD-10-CM | POA: Insufficient documentation

## 2012-10-13 DIAGNOSIS — R52 Pain, unspecified: Secondary | ICD-10-CM | POA: Insufficient documentation

## 2012-10-13 DIAGNOSIS — Z79899 Other long term (current) drug therapy: Secondary | ICD-10-CM | POA: Insufficient documentation

## 2012-10-13 DIAGNOSIS — Z8742 Personal history of other diseases of the female genital tract: Secondary | ICD-10-CM | POA: Insufficient documentation

## 2012-10-13 DIAGNOSIS — Z8739 Personal history of other diseases of the musculoskeletal system and connective tissue: Secondary | ICD-10-CM | POA: Insufficient documentation

## 2012-10-13 DIAGNOSIS — Z8709 Personal history of other diseases of the respiratory system: Secondary | ICD-10-CM | POA: Insufficient documentation

## 2012-10-13 DIAGNOSIS — Z3202 Encounter for pregnancy test, result negative: Secondary | ICD-10-CM | POA: Insufficient documentation

## 2012-10-13 DIAGNOSIS — R109 Unspecified abdominal pain: Secondary | ICD-10-CM | POA: Insufficient documentation

## 2012-10-13 DIAGNOSIS — F3289 Other specified depressive episodes: Secondary | ICD-10-CM | POA: Insufficient documentation

## 2012-10-13 DIAGNOSIS — Z8669 Personal history of other diseases of the nervous system and sense organs: Secondary | ICD-10-CM | POA: Insufficient documentation

## 2012-10-13 DIAGNOSIS — L299 Pruritus, unspecified: Secondary | ICD-10-CM | POA: Insufficient documentation

## 2012-10-13 DIAGNOSIS — F329 Major depressive disorder, single episode, unspecified: Secondary | ICD-10-CM | POA: Insufficient documentation

## 2012-10-13 DIAGNOSIS — J45909 Unspecified asthma, uncomplicated: Secondary | ICD-10-CM | POA: Insufficient documentation

## 2012-10-13 LAB — URINE MICROSCOPIC-ADD ON

## 2012-10-13 LAB — URINALYSIS, ROUTINE W REFLEX MICROSCOPIC
Hgb urine dipstick: NEGATIVE
Ketones, ur: 15 mg/dL — AB
Protein, ur: NEGATIVE mg/dL
Urobilinogen, UA: 0.2 mg/dL (ref 0.0–1.0)

## 2012-10-13 LAB — POCT PREGNANCY, URINE: Preg Test, Ur: NEGATIVE

## 2012-10-13 MED ORDER — HYDROCODONE-ACETAMINOPHEN 5-325 MG PO TABS: 2.0000 | ORAL_TABLET | ORAL | Status: DC | PRN

## 2012-10-13 MED ORDER — VALACYCLOVIR HCL 1 G PO TABS: 1000.0000 mg | ORAL_TABLET | Freq: Three times a day (TID) | ORAL | Status: AC

## 2012-10-13 MED ORDER — OXYCODONE-ACETAMINOPHEN 5-325 MG PO TABS
1.0000 | ORAL_TABLET | Freq: Once | ORAL | Status: AC
  Administered 2012-10-13: 1 via ORAL
  Filled 2012-10-13: qty 1

## 2012-10-13 MED ORDER — PREDNISONE 20 MG PO TABS: 60.0000 mg | ORAL_TABLET | Freq: Every day | ORAL | Status: DC

## 2012-10-13 MED ORDER — LIDOCAINE 5 % EX PTCH: 1.0000 | MEDICATED_PATCH | CUTANEOUS | Status: DC

## 2012-10-13 NOTE — ED Notes (Signed)
Pt presents to the ED with pain that radiates from right pelvic to right flank/lower back. Pt states she thinks she has shingles. No rash noted on skin. Pt reports the pain as burning, itching, and sore to touch. Pt denies dysuria, and hematuria. Also reports some nausea and states she was constipated last week. Pt is A&Ox4, skin warm and dry, respirations equal and unlabored

## 2012-10-13 NOTE — ED Provider Notes (Signed)
History    This chart was scribed for a non-physician practitioner, Wynetta Emery, working with Glynn Octave, MD by Frederik Pear, ED Scribe. This patient was seen in room WTR3/WLPT3 and the patient's care was started at 1955.   CSN: [id number]  Arrival date & time 10/13/12  1951   First MD Initiated Contact with Patient 10/13/12 1955      Chief Complaint  Patient presents with  . Abdominal Pain    (Consider location/radiation/quality/duration/timing/severity/associated sxs/prior treatment) The history is provided by medical records and the patient. No language interpreter was used.    Emily Woodard is a 32 y.o. female with a h/o of endometriosis who presents to the Emergency Department complaining of sudden onset, constant, throbbing, burning right lower abdomen pain with itching that radiates to her right lower back over her hip that has rapidly worsened today after 2 days of mild itching and mild discomfort. She denies having a fever, rash, dysuria, or hematuria. She states that she came to the ED tonight because she is concerned that she has shingles after searching for her symptoms online.She reports that she was seen earlier in the week by her PCP for rectal bleeding, constipation, and hemorrhoids. She has no h/o of DM, kidney stones.  Past Medical History  Diagnosis Date  . Abortion in first trimester 03/2000  . Depression   . Anxiety     hx panic disorder  . Asthma     no prob as adult - no inhaler  . Recurrent upper respiratory infection (URI)     05/14/2011 - tx with otc  . Seizures     last one 2002 - no meds  . Fibromyalgia     Past Surgical History  Procedure Laterality Date  . Dilation and evacuation  12/2001  . Colposcopy    . Wisdom tooth extraction    . Svd   07/2004, 08/2009    x 2  . Laparoscopic tubal ligation  05/23/2011    Procedure: LAPAROSCOPIC TUBAL LIGATION;  Surgeon: Zenaida Niece, MD;  Location: [facility];  Service: Gynecology;   Laterality: Bilateral;  . Iud removal  05/23/2011    Procedure: INTRAUTERINE DEVICE (IUD) REMOVAL;  Surgeon: Zenaida Niece, MD;  Location: [facility];  Service: Gynecology;;  . Endometrial ablation  05/23/2011    Procedure: ENDOMETRIAL ABLATION;  Surgeon: Zenaida Niece, MD;  Location: [facility];  Service: Gynecology;;    Family History  Problem Relation Age of Onset  . Diabetes Mother   . Hyperlipidemia Mother   . Hypertension Mother     History  Substance Use Topics  . Smoking status: Former Smoker -- 1.00 packs/day for 6 years    Types: Cigarettes    Quit date: 06/26/2006  . Smokeless tobacco: Never Used  . Alcohol Use: No    OB History   Grav Para Term Preterm Abortions TAB SAB Ect Mult Living                  Review of Systems  Constitutional: Negative for fever.  Respiratory: Negative for shortness of breath.   Cardiovascular: Negative for chest pain.  Gastrointestinal: Positive for abdominal pain (and itching). Negative for nausea, vomiting and diarrhea.  Skin: Negative for rash.  All other systems reviewed and are negative.    Allergies  Review of patient's allergies indicates no known allergies.  Home Medications   Current Outpatient Rx  Name  Route  Sig  Dispense  Refill  . bisacodyl (DULCOLAX)  5 MG EC tablet   Oral   Take 5 mg by mouth daily as needed for constipation.         . clonazePAM (KLONOPIN) 0.5 MG tablet   Oral   Take 0.25 mg by mouth daily as needed for anxiety (and sleep).          Marland Kitchen HYDROcodone-acetaminophen (NORCO/VICODIN) 5-325 MG per tablet   Oral   Take 1 tablet by mouth every 6 (six) hours as needed for pain.         Marland Kitchen loratadine (CLARITIN) 10 MG tablet   Oral   Take 10 mg by mouth daily as needed for allergies.         . magnesium sulfate (EPSOM SALT) crystals   Topical   Apply 1 application topically daily as needed. In soaking bath         . Milnacipran (SAVELLA) 50 MG TABS   Oral   Take 50 mg by mouth 2 (two)  times daily.         Marland Kitchen OVER THE COUNTER MEDICATION   Apply externally   Apply 1 application topically 3 (three) times daily as needed. Muscle therapy gel with arnica         . polyethylene glycol powder (GLYCOLAX/MIRALAX) powder   Oral   Take 17 g by mouth 2 (two) times daily. Until daily soft stools  OTC   255 g   0     BP 108/68  Pulse 97  Temp(Src) 97.7 F (36.5 C) (Oral)  Resp 18  Ht 5\' 4"  (1.626 m)  Wt 160 lb (72.576 kg)  BMI 27.45 kg/m2  SpO2 99%  LMP 08/26/2012  Physical Exam  Nursing note and vitals reviewed. Constitutional: She is oriented to person, place, and time. She appears well-developed and well-nourished. No distress.  HENT:  Head: Normocephalic.  Mouth/Throat: Oropharynx is clear and moist.  Eyes: Conjunctivae and EOM are normal. Pupils are equal, round, and reactive to light.  Neck: Normal range of motion.  Cardiovascular: Normal rate and intact distal pulses.   Pulmonary/Chest: Effort normal and breath sounds normal. No stridor. No respiratory distress. She has no wheezes. She has no rales. She exhibits no tenderness.  Abdominal: Soft. Bowel sounds are normal. She exhibits no distension and no mass. There is no tenderness. There is no rebound and no guarding.  Genitourinary:  No CVA tenderness bilaterally  Musculoskeletal: Normal range of motion. She exhibits no edema.  Neurological: She is alert and oriented to person, place, and time.  Skin: Skin is warm and dry. No rash noted. No erythema. No pallor.  Psychiatric: She has a normal mood and affect.    ED Course  Procedures (including critical care time)  DIAGNOSTIC STUDIES: Oxygen Saturation is 100% on room air, normal by my interpretation.    COORDINATION OF CARE:  19:59- Discussed planned course of treatment with the patient, including Percocet, a UA, pregnancy test, and abdominal CT without contrast, who is agreeable at this time.  Results for orders placed during the hospital  encounter of 10/13/12  URINALYSIS, ROUTINE W REFLEX MICROSCOPIC      Result Value Range   Color, Urine YELLOW  YELLOW   APPearance CLEAR  CLEAR   Specific Gravity, Urine 1.029  1.005 - 1.030   pH 5.0  5.0 - 8.0   Glucose, UA NEGATIVE  NEGATIVE mg/dL   Hgb urine dipstick NEGATIVE  NEGATIVE   Bilirubin Urine NEGATIVE  NEGATIVE   Ketones, ur 15 (*)  NEGATIVE mg/dL   Protein, ur NEGATIVE  NEGATIVE mg/dL   Urobilinogen, UA 0.2  0.0 - 1.0 mg/dL   Nitrite NEGATIVE  NEGATIVE   Leukocytes, UA SMALL (*) NEGATIVE  URINE MICROSCOPIC-ADD ON      Result Value Range   Squamous Epithelial / LPF FEW (*) RARE   WBC, UA 3-6  <3 WBC/hpf   Bacteria, UA FEW (*) RARE  POCT PREGNANCY, URINE      Result Value Range   Preg Test, Ur NEGATIVE  NEGATIVE     Labs Reviewed  URINALYSIS, ROUTINE W REFLEX MICROSCOPIC - Abnormal; Notable for the following:    Ketones, ur 15 (*)    Leukocytes, UA SMALL (*)    All other components within normal limits  URINE MICROSCOPIC-ADD ON - Abnormal; Notable for the following:    Squamous Epithelial / LPF FEW (*)    Bacteria, UA FEW (*)    All other components within normal limits  URINE CULTURE  POCT PREGNANCY, URINE   Ct Abdomen Pelvis Wo Contrast  10/13/2012  *RADIOLOGY REPORT*  Clinical Data:  Right flank pain constipation.  CT ABDOMEN AND PELVIS WITHOUT CONTRAST (CT UROGRAM)  Technique: Contiguous axial images of the abdomen and pelvis without oral or intravenous contrast were obtained.  Comparison: 09/15/2012  Findings:  Exam is limited for evaluation of entities other than urinary tract calculi due to lack of oral or intravenous contrast.   Lung bases:  Normal  Abdomen/pelvis:  Normal appearance of the liver, spleen, stomach, pancreas, gallbladder, biliary tract, adrenal glands.  No renal calculi or hydronephrosis.  A left pelvic calcification is vascular on image 69/series 2. No hydroureter or ureteric calculi.  No retroperitoneal or retrocrural adenopathy.  Normal  colon, appendix, and terminal ileum.  Normal small bowel without abdominal ascites.  No pelvic adenopathy.  Normal urinary bladder and uterus, without adnexal mass. Trace free pelvic fluid is likely physiologic.  Bones/Musculoskeletal:  No acute osseous abnormality.  IMPRESSION: No urinary tract calculi or hydronephrosis.  No other explanation for right-sided pain.  Possible constipation.   Trace free pelvic fluid is likely physiologic.   Original Report Authenticated By: Jeronimo Greaves, M.D.      1. Acute right flank pain       MDM   Emily Woodard is a 32 y.o. female complaining of pain in a dermatomal distribution, however there is no rash to suggest shingles. I think this might be kidney stones.  CT shows no stones. We'll give patient pain medication I will also give her treatment for shingles however I have advised her not to start the prednisone or acyclovir unless she sees a rash forming. Patient verbalized her understanding. Return precautions are given  Filed Vitals:   10/13/12 2004 10/13/12 2230 10/13/12 2300  BP: 108/68 105/71 119/78  Pulse: 97  80  Temp: 97.7 F (36.5 C)    TempSrc: Oral    Resp: 18    Height: 5\' 4"  (1.626 m)    Weight: 160 lb (72.576 kg)    SpO2: 99% 100% 100%     Pt verbalized understanding and agrees with care plan. Outpatient follow-up and return precautions given.    Discharge Medication List as of 10/13/2012 11:05 PM    START taking these medications   Details  !! HYDROcodone-acetaminophen (NORCO/VICODIN) 5-325 MG per tablet Take 2 tablets by mouth every 4 (four) hours as needed for pain., Starting 10/13/2012, Until Discontinued, Print    lidocaine (LIDODERM) 5 % Place 1 patch  onto the skin daily. Remove & Discard patch within 12 hours or as directed by MD, Starting 10/13/2012, Until Discontinued, Print    predniSONE (DELTASONE) 20 MG tablet Take 3 tablets (60 mg total) by mouth daily. Take 60 mg by mouth daily week 1, then 40mg  by mouth daily for  week 2, then 20mg  daily for week 3, Starting 10/13/2012, Until Discontinued, Print    valACYclovir (VALTREX) 1000 MG tablet Take 1 tablet (1,000 mg total) by mouth 3 (three) times daily., Starting 10/13/2012, Last dose on Sun 10/27/12, Print     !! - Potential duplicate medications found. Please discuss with provider.      I personally performed the services described in this documentation, which was scribed in my presence. The recorded information has been reviewed and is accurate.        Wynetta Emery, PA-C 10/14/12 [phone]

## 2012-10-14 NOTE — ED Provider Notes (Signed)
Medical screening examination/treatment/procedure(s) were performed by non-physician practitioner and as supervising physician I was immediately available for consultation/collaboration.  Glynn Octave, MD 10/14/12 [phone]

## 2012-10-15 LAB — URINE CULTURE: Colony Count: 50000

## 2013-04-16 ENCOUNTER — Encounter (HOSPITAL_COMMUNITY): Payer: Self-pay | Admitting: Emergency Medicine

## 2013-04-16 ENCOUNTER — Emergency Department (HOSPITAL_COMMUNITY)
Admission: EM | Admit: 2013-04-16 | Discharge: 2013-04-16 | Disposition: A | Discharge status: Home / Self Care | Payer: Managed Care, Other (non HMO) | Attending: Emergency Medicine | Admitting: Emergency Medicine

## 2013-04-16 DIAGNOSIS — R498 Other voice and resonance disorders: Secondary | ICD-10-CM | POA: Insufficient documentation

## 2013-04-16 DIAGNOSIS — F3289 Other specified depressive episodes: Secondary | ICD-10-CM | POA: Insufficient documentation

## 2013-04-16 DIAGNOSIS — M791 Myalgia, unspecified site: Secondary | ICD-10-CM

## 2013-04-16 DIAGNOSIS — F411 Generalized anxiety disorder: Secondary | ICD-10-CM | POA: Insufficient documentation

## 2013-04-16 DIAGNOSIS — F329 Major depressive disorder, single episode, unspecified: Secondary | ICD-10-CM | POA: Insufficient documentation

## 2013-04-16 DIAGNOSIS — M2550 Pain in unspecified joint: Secondary | ICD-10-CM | POA: Insufficient documentation

## 2013-04-16 DIAGNOSIS — Z23 Encounter for immunization: Secondary | ICD-10-CM | POA: Insufficient documentation

## 2013-04-16 DIAGNOSIS — Z87891 Personal history of nicotine dependence: Secondary | ICD-10-CM | POA: Insufficient documentation

## 2013-04-16 DIAGNOSIS — IMO0001 Reserved for inherently not codable concepts without codable children: Secondary | ICD-10-CM | POA: Insufficient documentation

## 2013-04-16 DIAGNOSIS — J029 Acute pharyngitis, unspecified: Secondary | ICD-10-CM | POA: Insufficient documentation

## 2013-04-16 DIAGNOSIS — R6883 Chills (without fever): Secondary | ICD-10-CM | POA: Insufficient documentation

## 2013-04-16 DIAGNOSIS — J45909 Unspecified asthma, uncomplicated: Secondary | ICD-10-CM | POA: Insufficient documentation

## 2013-04-16 DIAGNOSIS — M797 Fibromyalgia: Secondary | ICD-10-CM

## 2013-04-16 DIAGNOSIS — Z79899 Other long term (current) drug therapy: Secondary | ICD-10-CM | POA: Insufficient documentation

## 2013-04-16 DIAGNOSIS — G40909 Epilepsy, unspecified, not intractable, without status epilepticus: Secondary | ICD-10-CM | POA: Insufficient documentation

## 2013-04-16 MED ORDER — DEXAMETHASONE SODIUM PHOSPHATE 10 MG/ML IJ SOLN
10.0000 mg | Freq: Once | INTRAMUSCULAR | Status: AC
  Administered 2013-04-16: 10 mg via INTRAMUSCULAR
  Filled 2013-04-16: qty 1

## 2013-04-16 MED ORDER — METHOCARBAMOL 500 MG PO TABS
500.0000 mg | ORAL_TABLET | Freq: Once | ORAL | Status: AC
  Administered 2013-04-16: 500 mg via ORAL
  Filled 2013-04-16: qty 1

## 2013-04-16 MED ORDER — METHOCARBAMOL 500 MG PO TABS: 500.0000 mg | ORAL_TABLET | Freq: Two times a day (BID) | ORAL | Status: DC

## 2013-04-16 MED ORDER — TETANUS-DIPHTH-ACELL PERTUSSIS 5-2.5-18.5 LF-MCG/0.5 IM SUSP
0.5000 mL | Freq: Once | INTRAMUSCULAR | Status: AC
  Administered 2013-04-16: 0.5 mL via INTRAMUSCULAR
  Filled 2013-04-16: qty 0.5

## 2013-04-16 NOTE — ED Notes (Signed)
Pt reports body aches, feeling hoarse since yesterday and last week she moved was lifting heavy furniture and got scratched by a rusty nail so she wants a tetanus shot. She thinks she is either sore from moving or having body aches because she "is catching a cold." pt reports she has fibromyalgia and is being treated at pain clinic with suboxone

## 2013-04-16 NOTE — ED Provider Notes (Signed)
CSN: [id number]     Arrival date & time 04/16/13  1439 History  This chart was scribed for [place], [place], working with Junius Argyle, MD by Blanchard Kelch, ED Scribe. This patient was seen in room TR11C/TR11C and the patient's care was started at 3:58 PM.     Chief Complaint  Patient presents with  . Generalized Body Aches    The history is provided by the patient. No language interpreter was used.    HPI Comments: Emily Woodard is a 32 y.o. female who presents to the Emergency Department for constant generalized mylagias that began yesterday. She was moving two days ago and this may have caused the pain. She is also complaining of arthralgias, chills and voice change (scratchy and not muffled or garbled) described as "hoarse" onset today. She has been taking ibuprofen three times a day for the pain without relief. She has a past medical history of fibromyalgia and has had similar episodes of pain in the past. She sees Dr. Loleta Chance for pain management and takes Suboxone. She states she received a laceration while moving from a nail and is requesting a tetanus vaccination. She has been around sick contacts. She denies nasal congestion, rash, ear pain, fever.    Past Medical History  Diagnosis Date  . Abortion in first trimester 03/2000  . Depression   . Anxiety     hx panic disorder  . Asthma     no prob as adult - no inhaler  . Recurrent upper respiratory infection (URI)     05/14/2011 - tx with otc  . Seizures     last one 2002 - no meds  . Fibromyalgia    Past Surgical History  Procedure Laterality Date  . Dilation and evacuation  12/2001  . Colposcopy    . Wisdom tooth extraction    . Svd   07/2004, 08/2009    x 2  . Laparoscopic tubal ligation  05/23/2011    Procedure: LAPAROSCOPIC TUBAL LIGATION;  Surgeon: Zenaida Niece, MD;  Location: [facility];  Service: Gynecology;  Laterality: Bilateral;  . Iud removal  05/23/2011    Procedure: INTRAUTERINE DEVICE (IUD) REMOVAL;   Surgeon: Zenaida Niece, MD;  Location: [facility];  Service: Gynecology;;  . Endometrial ablation  05/23/2011    Procedure: ENDOMETRIAL ABLATION;  Surgeon: Zenaida Niece, MD;  Location: [facility];  Service: Gynecology;;   Family History  Problem Relation Age of Onset  . Diabetes Mother   . Hyperlipidemia Mother   . Hypertension Mother    History  Substance Use Topics  . Smoking status: Former Smoker -- 1.00 packs/day for 6 years    Types: Cigarettes    Quit date: 06/26/2006  . Smokeless tobacco: Never Used  . Alcohol Use: No   OB History   Grav Para Term Preterm Abortions TAB SAB Ect Mult Living                 Review of Systems  Constitutional: Positive for chills. Negative for fever.  HENT: Positive for voice change. Negative for congestion, ear pain and sore throat.   Musculoskeletal: Positive for arthralgias and myalgias.  Skin: Negative for rash.  All other systems reviewed and are negative.    Allergies  Review of patient's allergies indicates no known allergies.  Home Medications   Current Outpatient Rx  Name  Route  Sig  Dispense  Refill  . ARIPiprazole (ABILIFY) 5 MG tablet   Oral   Take  5 mg by mouth at bedtime.         . buprenorphine-naloxone (SUBOXONE) 8-2 MG SUBL SL tablet   Sublingual   Place 1 tablet under the tongue 2 (two) times daily.         . clonazePAM (KLONOPIN) 0.5 MG tablet   Oral   Take 0.5 mg by mouth 4 (four) times daily as needed for anxiety.         Marland Kitchen ibuprofen (ADVIL,MOTRIN) 200 MG tablet   Oral   Take 800 mg by mouth every 6 (six) hours as needed for pain.         Marland Kitchen lamoTRIgine (LAMICTAL) 100 MG tablet   Oral   Take 100 mg by mouth every morning.         . Milnacipran (SAVELLA) 50 MG TABS tablet   Oral   Take 50 mg by mouth 2 (two) times daily.         . methocarbamol (ROBAXIN) 500 MG tablet   Oral   Take 1 tablet (500 mg total) by mouth 2 (two) times daily.   20 tablet   0    Triage Vitals: BP 128/82   Pulse 86  Temp(Src) 98.1 F (36.7 C) (Oral)  Resp 15  Ht 5\' 4"  (1.626 m)  Wt 160 lb 4.8 oz (72.712 kg)  BMI 27.5 kg/m2  SpO2 98%  Physical Exam  Nursing note and vitals reviewed. Constitutional: She is oriented to person, place, and time. She appears well-developed and well-nourished. No distress.  Awake, alert, nontoxic appearance  HENT:  Head: Normocephalic and atraumatic.  Right Ear: Tympanic membrane, external ear and ear canal normal.  Left Ear: Tympanic membrane, external ear and ear canal normal.  Nose: Mucosal edema (mild) present. No rhinorrhea. No epistaxis. Right sinus exhibits no maxillary sinus tenderness and no frontal sinus tenderness. Left sinus exhibits no maxillary sinus tenderness and no frontal sinus tenderness.  Mouth/Throat: Uvula is midline and mucous membranes are normal. Mucous membranes are not pale and not cyanotic. Posterior oropharyngeal edema and posterior oropharyngeal erythema present. No oropharyngeal exudate or tonsillar abscesses.  Pharynx mildly erythematous with mildly edematous tonsils and open crypts; no exudate  Eyes: Conjunctivae and EOM are normal. Pupils are equal, round, and reactive to light. No scleral icterus.  Neck: Normal range of motion, full passive range of motion without pain and phonation normal. Neck supple. No spinous process tenderness and no muscular tenderness present. No rigidity. Normal range of motion present.  Normal phonation - specifically no muffled voice or hot potato voice No nuchal rigidity  Cardiovascular: Normal rate, regular rhythm, S1 normal, S2 normal, normal heart sounds and intact distal pulses.   Pulses:      Radial pulses are 2+ on the right side, and 2+ on the left side.       Dorsalis pedis pulses are 2+ on the right side, and 2+ on the left side.       Posterior tibial pulses are 2+ on the right side, and 2+ on the left side.  Pulmonary/Chest: Effort normal and breath sounds normal. No accessory muscle usage  or stridor. Not tachypneic. No respiratory distress. She has no decreased breath sounds. She has no wheezes. She has no rhonchi. She has no rales.  Abdominal: Soft. Normal appearance and bowel sounds are normal. She exhibits no mass. There is no tenderness. There is no rebound, no guarding and no CVA tenderness.  Musculoskeletal: Normal range of motion. She exhibits no edema.  All  joints are without swelling, erythema or increased warmth  Lymphadenopathy:       Head (right side): No submental, no submandibular, no tonsillar, no preauricular, no posterior auricular and no occipital adenopathy present.       Head (left side): No submental, no submandibular, no tonsillar, no preauricular, no posterior auricular and no occipital adenopathy present.    She has no cervical adenopathy.       Right cervical: No superficial cervical, no deep cervical and no posterior cervical adenopathy present.      Left cervical: No superficial cervical, no deep cervical and no posterior cervical adenopathy present.       Right: No supraclavicular adenopathy present.       Left: No supraclavicular adenopathy present.  No palpable lymphadenopathy  Neurological: She is alert and oriented to person, place, and time.  Speech is clear and goal oriented Moves extremities without ataxia  Skin: Skin is warm and dry. No rash noted. She is not diaphoretic.  No visible scratches, lacerations, abrasions or puncture wounds on the L arm or left foot were pt endorses possible trauma.    Psychiatric: She has a normal mood and affect.    ED Course  Procedures (including critical care time)  DIAGNOSTIC STUDIES: Oxygen Saturation is 98% on room air, normal by my interpretation.    COORDINATION OF CARE: 4:05 PM -Will order Tdap and steroid injection. Clinical suspicion of viral infection. Patient verbalizes understanding and agrees with treatment plan.    Labs Review Labs Reviewed - No data to display Imaging Review No  results found.  EKG Interpretation   None       MDM   1. Myalgia   2. Acute viral pharyngitis   3. Vaccine for tetanus toxoid   4. Fibromyalgia muscle pain     Emily Woodard presents with a Hx of fibromyalgia and increased myalgias today 2 days after moving.  Pt exam consistent with viral pharyngitis which is one possible cause for her myalgias. Patient given dexamethasone.  Patient's myalgias also potentially from lifting heavy furniture. Will DC home with Robaxin to help treat any muscle spasms though there are none palpable on exam.  Patient also endorses being "scratched by a rusty nail" there is no evidence of puncture, abrasion or laceration. Patient's tetanus updated.  Patient likely with fiber myalgia exacerbation secondary to her viral illness or increased activity. Recommend followup with her primary care and pain management.  No evidence for epiglottitis.  Patient alert, oriented, nontoxic nonseptic appearing, afebrile and not tachycardic.  It has been determined that no acute conditions requiring further emergency intervention are present at this time. The patient/guardian have been advised of the diagnosis and plan. We have discussed signs and symptoms that warrant return to the ED, such as changes or worsening in symptoms.   Vital signs are stable at discharge.   BP 128/82  Pulse 86  Temp(Src) 98.1 F (36.7 C) (Oral)  Resp 15  Ht 5\' 4"  (1.626 m)  Wt 160 lb 4.8 oz (72.712 kg)  BMI 27.5 kg/m2  SpO2 98%  Patient/guardian has voiced understanding and agreed to follow-up with the PCP or specialist.    I personally performed the services described in this documentation, which was scribed in my presence. The recorded information has been reviewed and is accurate.      Dahlia Client Early Steel, PA-C 04/16/13 2136

## 2013-04-17 NOTE — ED Provider Notes (Signed)
Medical screening examination/treatment/procedure(s) were performed by non-physician practitioner and as supervising physician I was immediately available for consultation/collaboration.    Junius Argyle, MD 04/17/13 [phone]

## 2013-06-11 IMAGING — CT CT ABD-PELV W/ CM
1 of 2 series · 15 of 32 positions shown, 19 images · IV contrast (OMNIPAQUE 300)
Comparison: 03/26/2011

CLINICAL DATA: Abdominal pain.  Fibromyalgia.  Lethargy.

CT ABDOMEN AND PELVIS WITH CONTRAST
TECHNIQUE: Multidetector CT imaging of the abdomen and pelvis was
performed following the standard protocol during bolus
administration of intravenous contrast.
Contrast:  100 ml [id number]

[Series 2: abd/pel with · axial · 0.69mm/px · z∈[-472,-47]mm · 15 of 93 slices shown, 19 images]
[im 4/93  soft-tissue]
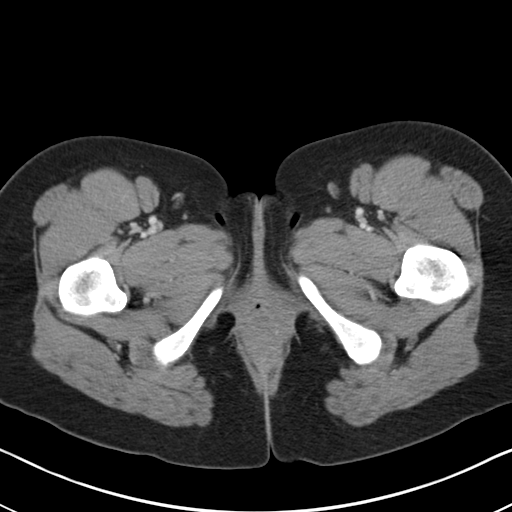
[im 4/93  bone]
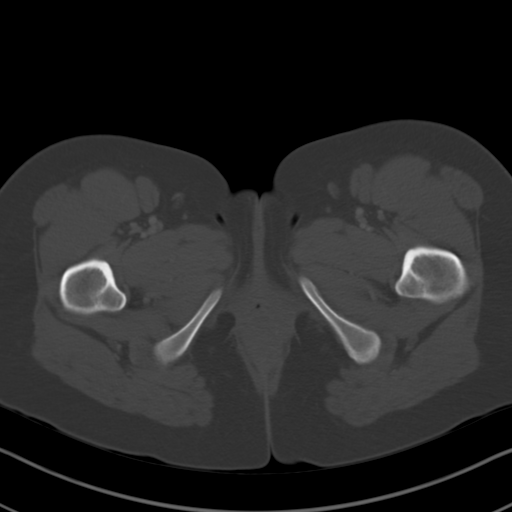
[im 12/93  soft-tissue]
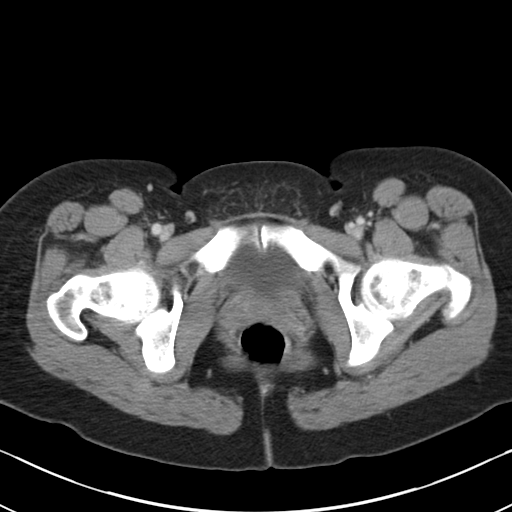
[im 20/93  soft-tissue]
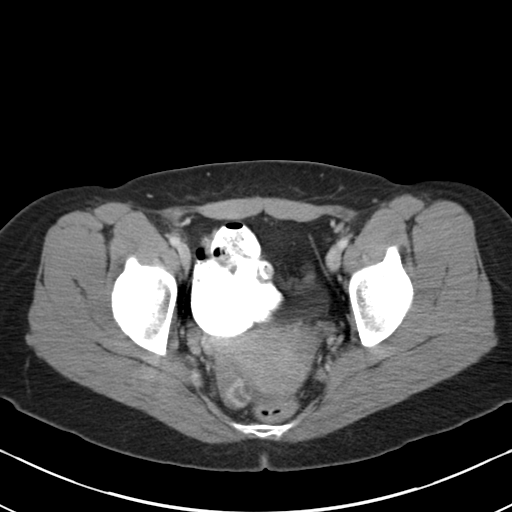
[im 27/93  soft-tissue]
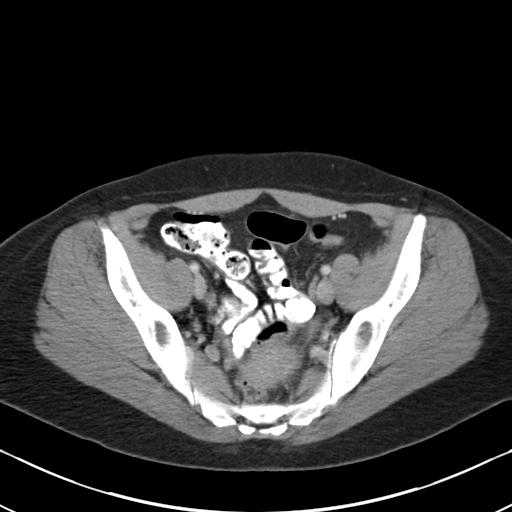
[im 31/93  soft-tissue]
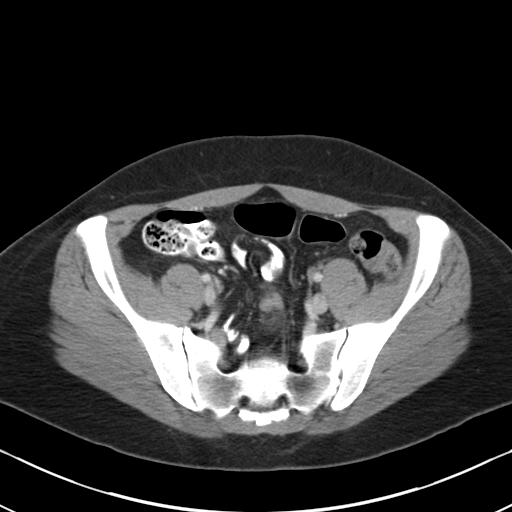
[im 39/93  soft-tissue]
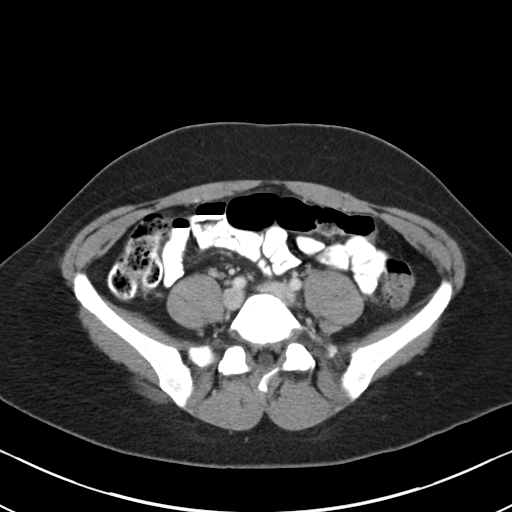
[im 47/93  soft-tissue]
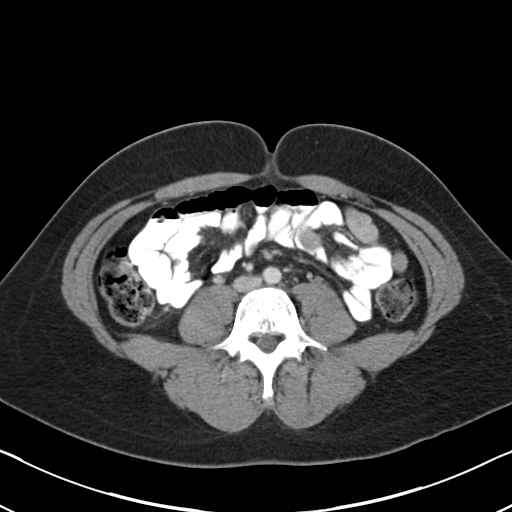
[im 54/93  soft-tissue]
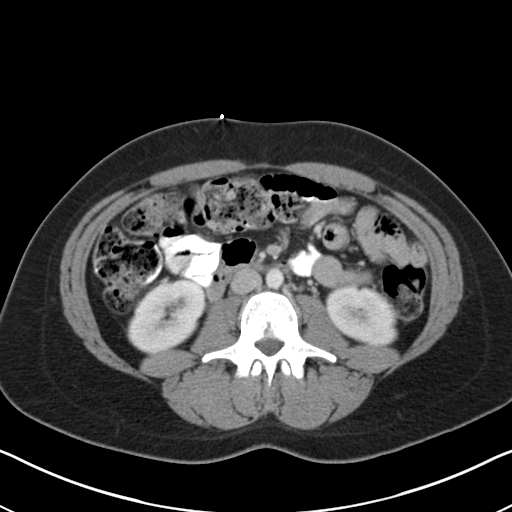
[im 62/93  soft-tissue]
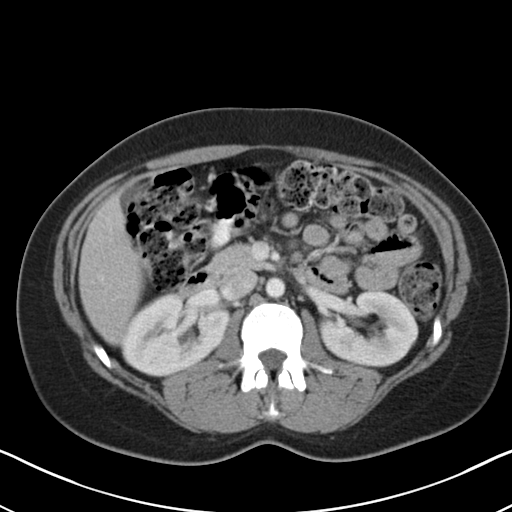
[im 62/93  bone]
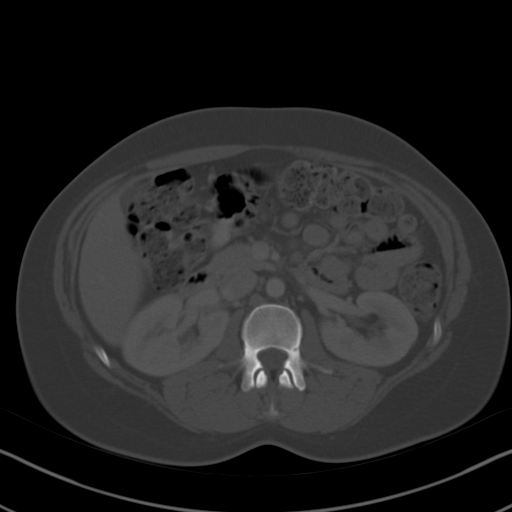
[im 66/93  soft-tissue]
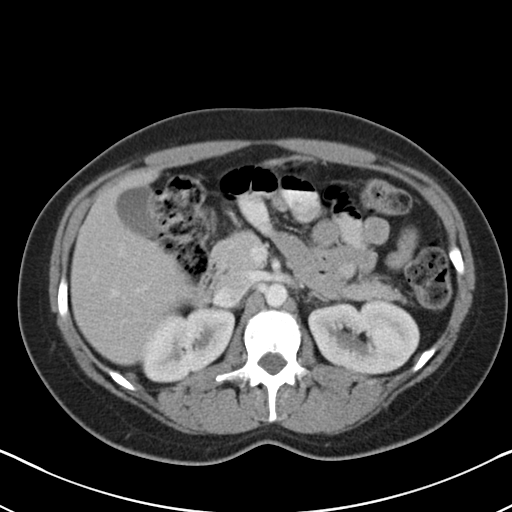
[im 73/93  soft-tissue]
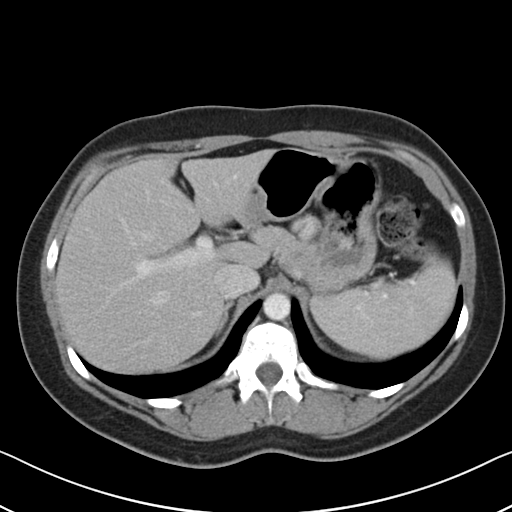
[im 77/93  lung]
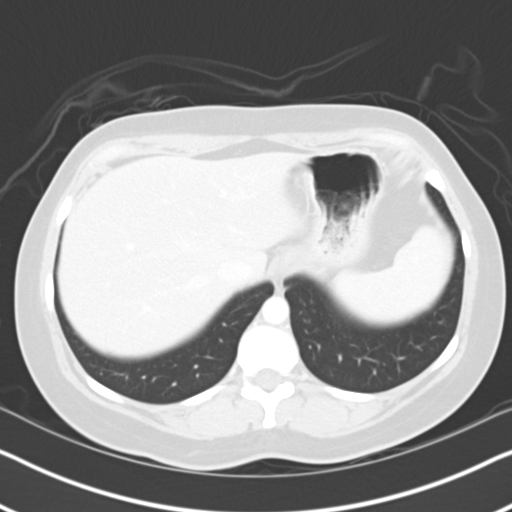
[im 81/93  soft-tissue]
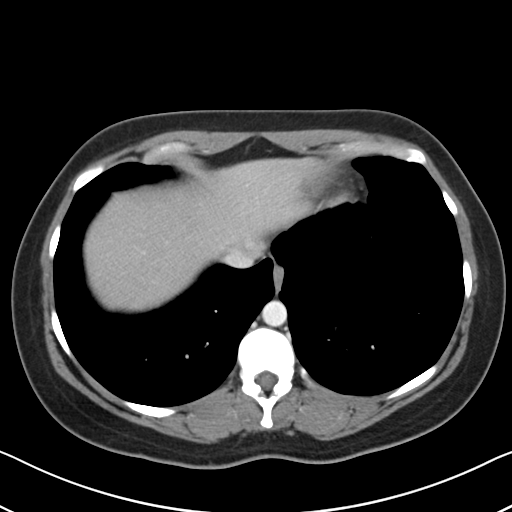
[im 81/93  lung]
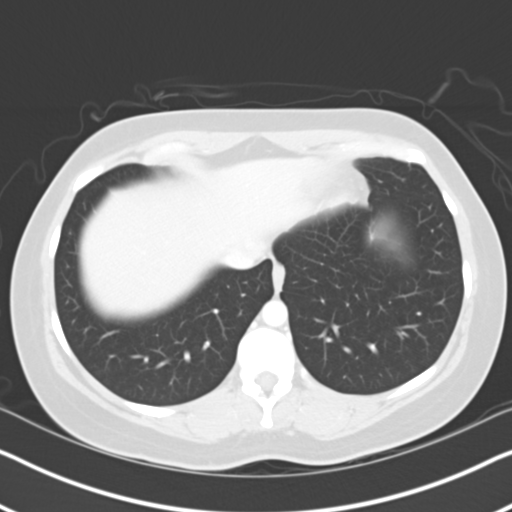
[im 85/93  lung]
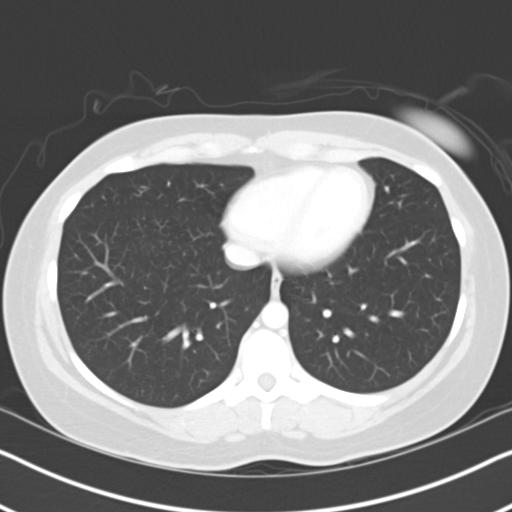
[im 89/93  soft-tissue]
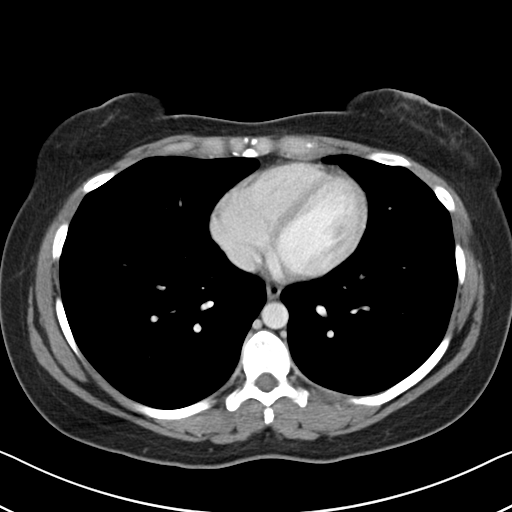
[im 89/93  lung]
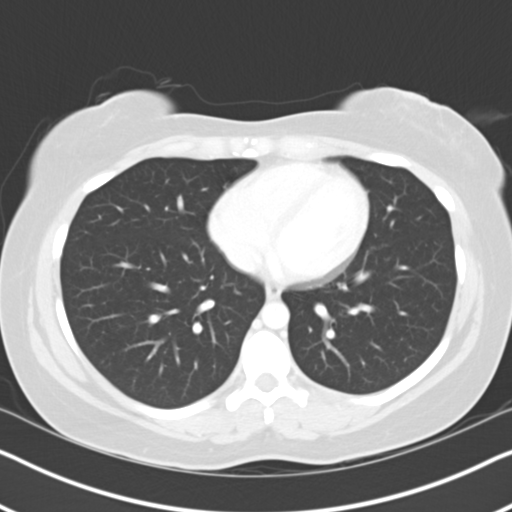

[15 of 32 positions shown; findings below may reference images not displayed]

FINDINGS: The liver, spleen, pancreas, and adrenal glands appear
unremarkable.

The gallbladder and biliary system appear unremarkable.

The kidneys appear unremarkable, as do the proximal ureters.

No pathologic retroperitoneal or porta hepatis adenopathy is
identified.

Prominence of stool throughout the colon suggests constipation.
Orally administered contrast extends to the transverse colon.
Appendix unremarkable.

2 cm lesion with rim enhancement in the right ovary probably a
corpus luteum or a small collapsed cyst.  No free pelvic fluid
observed.

The rectus and lateral abdominal wall musculature appear normal.
No focal subcutaneous lesion is observed in the right abdomen.  No
hernia observed.
IMPRESSION: 1. Prominence of stool throughout the colon suggests constipation.
2.  Suspected corpus luteum or small collapsed cyst in the right
ovary, without free pelvic fluid.

## 2014-01-08 ENCOUNTER — Other Ambulatory Visit: Payer: Self-pay | Admitting: Family Medicine

## 2014-01-08 DIAGNOSIS — R1031 Right lower quadrant pain: Secondary | ICD-10-CM

## 2014-01-08 DIAGNOSIS — M545 Low back pain, unspecified: Secondary | ICD-10-CM

## 2014-01-12 ENCOUNTER — Ambulatory Visit
Admission: RE | Admit: 2014-01-12 | Discharge: 2014-01-12 | Disposition: A | Discharge status: Home / Self Care | Payer: Managed Care, Other (non HMO) | Source: Ambulatory Visit | Attending: Family Medicine | Admitting: Family Medicine

## 2014-01-12 DIAGNOSIS — M545 Low back pain, unspecified: Secondary | ICD-10-CM

## 2014-01-12 DIAGNOSIS — R1031 Right lower quadrant pain: Secondary | ICD-10-CM

## 2014-03-07 ENCOUNTER — Encounter (HOSPITAL_COMMUNITY): Payer: Self-pay | Admitting: Emergency Medicine

## 2014-03-07 ENCOUNTER — Emergency Department (HOSPITAL_COMMUNITY)
Admission: EM | Admit: 2014-03-07 | Discharge: 2014-03-07 | Disposition: A | Discharge status: Home / Self Care | Payer: 59 | Attending: Emergency Medicine | Admitting: Emergency Medicine

## 2014-03-07 DIAGNOSIS — IMO0001 Reserved for inherently not codable concepts without codable children: Secondary | ICD-10-CM | POA: Diagnosis not present

## 2014-03-07 DIAGNOSIS — Z79899 Other long term (current) drug therapy: Secondary | ICD-10-CM | POA: Diagnosis not present

## 2014-03-07 DIAGNOSIS — Z87891 Personal history of nicotine dependence: Secondary | ICD-10-CM | POA: Diagnosis not present

## 2014-03-07 DIAGNOSIS — F411 Generalized anxiety disorder: Secondary | ICD-10-CM | POA: Insufficient documentation

## 2014-03-07 DIAGNOSIS — N390 Urinary tract infection, site not specified: Secondary | ICD-10-CM | POA: Diagnosis not present

## 2014-03-07 DIAGNOSIS — J45909 Unspecified asthma, uncomplicated: Secondary | ICD-10-CM | POA: Insufficient documentation

## 2014-03-07 DIAGNOSIS — R350 Frequency of micturition: Secondary | ICD-10-CM | POA: Insufficient documentation

## 2014-03-07 DIAGNOSIS — Z3202 Encounter for pregnancy test, result negative: Secondary | ICD-10-CM | POA: Diagnosis not present

## 2014-03-07 DIAGNOSIS — F3289 Other specified depressive episodes: Secondary | ICD-10-CM | POA: Insufficient documentation

## 2014-03-07 DIAGNOSIS — F329 Major depressive disorder, single episode, unspecified: Secondary | ICD-10-CM | POA: Diagnosis not present

## 2014-03-07 LAB — URINE MICROSCOPIC-ADD ON

## 2014-03-07 LAB — URINALYSIS, ROUTINE W REFLEX MICROSCOPIC
Bilirubin Urine: NEGATIVE
GLUCOSE, UA: NEGATIVE mg/dL
Hgb urine dipstick: NEGATIVE
Ketones, ur: NEGATIVE mg/dL
NITRITE: POSITIVE — AB
PH: 6 (ref 5.0–8.0)
Protein, ur: NEGATIVE mg/dL
SPECIFIC GRAVITY, URINE: 1.021 (ref 1.005–1.030)
Urobilinogen, UA: 1 mg/dL (ref 0.0–1.0)

## 2014-03-07 LAB — I-STAT CHEM 8, ED
BUN: 13 mg/dL (ref 6–23)
CHLORIDE: 103 meq/L (ref 96–112)
Calcium, Ion: 1.04 mmol/L — ABNORMAL LOW (ref 1.12–1.23)
Creatinine, Ser: 0.8 mg/dL (ref 0.50–1.10)
Glucose, Bld: 98 mg/dL (ref 70–99)
HEMATOCRIT: 39 % (ref 36.0–46.0)
Hemoglobin: 13.3 g/dL (ref 12.0–15.0)
POTASSIUM: 3.9 meq/L (ref 3.7–5.3)
SODIUM: 138 meq/L (ref 137–147)
TCO2: 25 mmol/L (ref 0–100)

## 2014-03-07 LAB — PREGNANCY, URINE: PREG TEST UR: NEGATIVE

## 2014-03-07 MED ORDER — IBUPROFEN 800 MG PO TABS
800.0000 mg | ORAL_TABLET | Freq: Once | ORAL | Status: AC
  Administered 2014-03-07: 800 mg via ORAL
  Filled 2014-03-07: qty 1

## 2014-03-07 MED ORDER — SULFAMETHOXAZOLE-TRIMETHOPRIM 800-160 MG PO TABS: 1.0000 | ORAL_TABLET | Freq: Two times a day (BID) | ORAL | Status: AC

## 2014-03-07 NOTE — ED Provider Notes (Signed)
CSN: [id number]     Arrival date & time 03/07/14  1511 History   First MD Initiated Contact with Patient 03/07/14 1652     Chief Complaint  Patient presents with  . Back Pain  . Urinary Frequency     (Consider location/radiation/quality/duration/timing/severity/associated sxs/prior Treatment) The history is provided by the patient.    Patient presents with multiple complaints.  Pt with hx fibromyalgia p/w  1. episode of bilateral foot swelling after work yesterday that has resolved.  States that is what prompted her ED visit today because this is unusual for her.   2. Also notes she sneezed 4 days ago while sitting at her desk and felt a pop in her left upper back and has since been sore, feels she pulled a muscle.  3. Has had two weeks of occasional lightheadedness, feels like she is "floating" while she is walking.   4. Urinary odor x 2 months with urgency and "bladder pain."   Has vague lower abdominal tenderness that is unchanged x 2 months. Has been worked up by PCP with UA, abdominal and pelvic US./  Symptoms unchanged.  5. Increased fibromyalgia pain.  Pain is everywhere.  This is a typical flare for her.     Denies fevers, CP.  Denies vaginal symptoms.  Is having normal periods.     Past Medical History  Diagnosis Date  . Abortion in first trimester 03/2000  . Depression   . Anxiety     hx panic disorder  . Asthma     no prob as adult - no inhaler  . Recurrent upper respiratory infection (URI)     05/14/2011 - tx with otc  . Seizures     last one 2002 - no meds  . Fibromyalgia    Past Surgical History  Procedure Laterality Date  . Dilation and evacuation  12/2001  . Colposcopy    . Wisdom tooth extraction    . Svd   07/2004, 08/2009    x 2  . Laparoscopic tubal ligation  05/23/2011    Procedure: LAPAROSCOPIC TUBAL LIGATION;  Surgeon: Zenaida Niece, MD;  Location: [facility];  Service: Gynecology;  Laterality: Bilateral;  . Iud removal  05/23/2011    Procedure:  INTRAUTERINE DEVICE (IUD) REMOVAL;  Surgeon: Zenaida Niece, MD;  Location: [facility];  Service: Gynecology;;  . Endometrial ablation  05/23/2011    Procedure: ENDOMETRIAL ABLATION;  Surgeon: Zenaida Niece, MD;  Location: [facility];  Service: Gynecology;;   Family History  Problem Relation Age of Onset  . Diabetes Mother   . Hyperlipidemia Mother   . Hypertension Mother    History  Substance Use Topics  . Smoking status: Former Smoker -- 1.00 packs/day for 6 years    Types: Cigarettes    Quit date: 06/26/2006  . Smokeless tobacco: Never Used  . Alcohol Use: No   OB History   Grav Para Term Preterm Abortions TAB SAB Ect Mult Living                 Review of Systems  All other systems reviewed and are negative.     Allergies  Review of patient's allergies indicates no known allergies.  Home Medications   Prior to Admission medications   Medication Sig Start Date End Date Taking? Authorizing Provider  buprenorphine-naloxone (SUBOXONE) 8-2 MG SUBL SL tablet Place 1 tablet under the tongue 2 (two) times daily.   Yes Historical Provider, MD  clonazePAM (KLONOPIN) 0.5 MG tablet  Take 0.5 mg by mouth 3 (three) times daily as needed for anxiety.    Yes Historical Provider, MD  DULoxetine (CYMBALTA) 60 MG capsule Take 60 mg by mouth 2 (two) times daily.   Yes Historical Provider, MD  ibuprofen (ADVIL,MOTRIN) 200 MG tablet Take 800 mg by mouth every 6 (six) hours as needed for pain.   Yes Historical Provider, MD   BP 115/73  Pulse 83  Temp(Src) 98.8 F (37.1 C) (Oral)  Resp 18  SpO2 95% Physical Exam  Nursing note and vitals reviewed. Constitutional: She appears well-developed and well-nourished. No distress.  HENT:  Head: Normocephalic and atraumatic.  Neck: Neck supple.  Cardiovascular: Normal rate and regular rhythm.   Pulmonary/Chest: Effort normal and breath sounds normal. No respiratory distress. She has no wheezes. She has no rales.  Abdominal: Soft. She exhibits no  distension and no mass. There is tenderness. There is no rebound and no guarding.  Mild diffuse lower abdominal tenderness.  Musculoskeletal:       Back:  Spine nontender, no crepitus, or stepoffs.   Neurological: She is alert.  Skin: She is not diaphoretic.    ED Course  Procedures (including critical care time) Labs Review Labs Reviewed  URINALYSIS, ROUTINE W REFLEX MICROSCOPIC - Abnormal; Notable for the following:    APPearance CLOUDY (*)    Nitrite POSITIVE (*)    Leukocytes, UA MODERATE (*)    All other components within normal limits  URINE MICROSCOPIC-ADD ON - Abnormal; Notable for the following:    Bacteria, UA MANY (*)    All other components within normal limits  I-STAT CHEM 8, ED - Abnormal; Notable for the following:    Calcium, Ion 1.04 (*)    All other components within normal limits  URINE CULTURE  PREGNANCY, URINE    Imaging Review No results found.   EKG Interpretation None      MDM   Final diagnoses:  UTI (lower urinary tract infection)   Afebrile, nontoxic patient with urinary symptoms, intermittent lightheadedness, fibromyalgia flare.  Does have some reproducible soreness in her left upper back that occurred when she sneezed.  She had spontaneously resolved bilateral ankle edema last night after working all day, no CP, no SOB.   Found to have UTI.  Chem 8 unremarkable.  D/C home with bactrim, PCP, pain management follow up.  Discussed result, findings, treatment, and follow up  with patient.  Pt given return precautions.  Pt verbalizes understanding and agrees with plan.         Trixie Dredge, PA-C 03/07/14 2005

## 2014-03-07 NOTE — ED Provider Notes (Signed)
Medical screening examination/treatment/procedure(s) were performed by non-physician practitioner and as supervising physician I was immediately available for consultation/collaboration.    Linwood Dibbles, MD 03/07/14 2330
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
## 2014-03-07 NOTE — Discharge Instructions (Signed)
Read the information below.  Use the prescribed medication as directed.  Please discuss all new medications with your pharmacist.  You may return to the Emergency Department at any time for worsening condition or any new symptoms that concern you.      Urinary Tract Infection A urinary tract infection (UTI) can occur any place along the urinary tract. The tract includes the kidneys, ureters, bladder, and urethra. A type of germ called bacteria often causes a UTI. UTIs are often helped with antibiotic medicine.  HOME CARE   If given, take antibiotics as told by your doctor. Finish them even if you start to feel better.  Drink enough fluids to keep your pee (urine) clear or pale yellow.  Avoid tea, drinks with caffeine, and bubbly (carbonated) drinks.  Pee often. Avoid holding your pee in for a long time.  Pee before and after having sex (intercourse).  Wipe from front to back after you poop (bowel movement) if you are a woman. Use each tissue only once. GET HELP RIGHT AWAY IF:   You have back pain.  You have lower belly (abdominal) pain.  You have chills.  You feel sick to your stomach (nauseous).  You throw up (vomit).  Your burning or discomfort with peeing does not go away.  You have a fever.  Your symptoms are not better in 3 days. MAKE SURE YOU:   Understand these instructions.  Will watch your condition.  Will get help right away if you are not doing well or get worse. Document Released: 11/29/2007 Document Revised: 03/06/2012 Document Reviewed: 01/11/2012 [facility] Patient Information 2015 [place], [place]. This information is not intended to replace advice given to you by your health care provider. Make sure you discuss any questions you have with your health care provider.

## 2014-03-10 ENCOUNTER — Telehealth (HOSPITAL_BASED_OUTPATIENT_CLINIC_OR_DEPARTMENT_OTHER): Payer: Self-pay | Admitting: Emergency Medicine

## 2014-03-10 LAB — URINE CULTURE

## 2014-03-11 ENCOUNTER — Telehealth (HOSPITAL_BASED_OUTPATIENT_CLINIC_OR_DEPARTMENT_OTHER): Payer: Self-pay | Admitting: Emergency Medicine

## 2014-03-11 NOTE — Telephone Encounter (Signed)
Post ED Visit - Positive Culture Follow-up: Successful Patient Follow-Up  Culture assessed and recommendations reviewed by:  Wes Dulaney, Pharm.D., BCPS  Celedonio Miyamoto, Pharm.D., BCPS  Georgina Pillion, [address].D., BCPS  [place], [place].D., BCPS, AAHIVP  Estella Husk, Pharm.D., BCPS, AAHIVP  Red Christians, Pharm.D.  Carly Sabat, Pharm D [place], [place].D.  Positive Urine culture   Patient discharged without antimicrobial prescription and treatment is now indicated  Organism is resistant to prescribed ED discharge antimicrobial  Patient with positive blood cultures  Changes discussed with ED provider: Junious Silk, PA New antibiotic prescription: Macrobid 100 mg BID x seven days Stop Bactrim  03/11/14 @ 1400 left voicemail to call flow managers #   Jiles Harold 03/11/2014, 2:01 PM

## 2014-03-11 NOTE — Progress Notes (Signed)
ED Antimicrobial Stewardship Positive Culture Follow Up   Emily Woodard is an 33 y.o. female who presented to [facility] on 03/07/2014 with a chief complaint of  Chief Complaint  Patient presents with  . Back Pain  . Urinary Frequency    Recent Results (from the past 720 hour(s))  URINE CULTURE     Status: None   Collection Time    03/07/14  4:48 PM      Result Value Ref Range Status   Specimen Description URINE, CLEAN CATCH   Final   Special Requests NONE   Final   Culture  Setup Time     Final   Value: 03/07/2014 21:35     Performed at [facility] Count     Final   Value: >=100,000 COLONIES/ML     Performed at [facility]   Culture     Final   Value: ESCHERICHIA COLI     Note: Confirmed Extended Spectrum Beta-Lactamase Producer (ESBL) CRITICAL RESULT CALLED TO, READ BACK BY AND VERIFIED WITH: L MILLER 03/10/14 AT 845 AM BY [facility]     Performed at [facility]   Report Status 03/10/2014 FINAL   Final   Organism ID, Bacteria ESCHERICHIA COLI   Final     Treated with Bactrim, organism resistant to prescribed antimicrobial  Patient discharged originally without antimicrobial agent and treatment is now indicated  New antibiotic prescription: Marco-Bid  PO BID x 7 days  ED Provider: Junious Silk, PA-C   Dennise Bamber M 03/11/2014, 9:10 AM Infectious Diseases Pharmacist Phone# [phone]

## 2014-03-15 ENCOUNTER — Telehealth (HOSPITAL_COMMUNITY): Payer: Self-pay

## 2014-03-15 NOTE — ED Notes (Signed)
Unable to reach by telephone. Letter sent to address on record.  

## 2014-04-10 ENCOUNTER — Telehealth (HOSPITAL_COMMUNITY): Payer: Self-pay

## 2014-04-10 NOTE — ED Notes (Signed)
Unable to contact pt by mail or telephone. Unable to communicate lab results or treatment changes. 

## 2014-06-10 ENCOUNTER — Emergency Department (HOSPITAL_COMMUNITY)
Admission: EM | Admit: 2014-06-10 | Discharge: 2014-06-10 | Disposition: A | Discharge status: Home / Self Care | Payer: 59 | Attending: Emergency Medicine | Admitting: Emergency Medicine

## 2014-06-10 ENCOUNTER — Encounter (HOSPITAL_COMMUNITY): Payer: Self-pay | Admitting: Emergency Medicine

## 2014-06-10 ENCOUNTER — Emergency Department (HOSPITAL_COMMUNITY): Payer: 59

## 2014-06-10 DIAGNOSIS — Z79899 Other long term (current) drug therapy: Secondary | ICD-10-CM | POA: Diagnosis not present

## 2014-06-10 DIAGNOSIS — J069 Acute upper respiratory infection, unspecified: Secondary | ICD-10-CM | POA: Insufficient documentation

## 2014-06-10 DIAGNOSIS — F410 Panic disorder [episodic paroxysmal anxiety] without agoraphobia: Secondary | ICD-10-CM | POA: Diagnosis not present

## 2014-06-10 DIAGNOSIS — Z87891 Personal history of nicotine dependence: Secondary | ICD-10-CM | POA: Insufficient documentation

## 2014-06-10 DIAGNOSIS — Z3202 Encounter for pregnancy test, result negative: Secondary | ICD-10-CM | POA: Insufficient documentation

## 2014-06-10 DIAGNOSIS — N390 Urinary tract infection, site not specified: Secondary | ICD-10-CM | POA: Insufficient documentation

## 2014-06-10 DIAGNOSIS — M797 Fibromyalgia: Secondary | ICD-10-CM | POA: Diagnosis not present

## 2014-06-10 DIAGNOSIS — R509 Fever, unspecified: Secondary | ICD-10-CM | POA: Diagnosis present

## 2014-06-10 DIAGNOSIS — R05 Cough: Secondary | ICD-10-CM

## 2014-06-10 DIAGNOSIS — F329 Major depressive disorder, single episode, unspecified: Secondary | ICD-10-CM | POA: Insufficient documentation

## 2014-06-10 DIAGNOSIS — J45901 Unspecified asthma with (acute) exacerbation: Secondary | ICD-10-CM | POA: Insufficient documentation

## 2014-06-10 DIAGNOSIS — B349 Viral infection, unspecified: Secondary | ICD-10-CM | POA: Insufficient documentation

## 2014-06-10 DIAGNOSIS — R059 Cough, unspecified: Secondary | ICD-10-CM

## 2014-06-10 LAB — COMPREHENSIVE METABOLIC PANEL
ALT: 65 U/L — ABNORMAL HIGH (ref 0–35)
ANION GAP: 13 (ref 5–15)
AST: 87 U/L — ABNORMAL HIGH (ref 0–37)
Albumin: 3.7 g/dL (ref 3.5–5.2)
Alkaline Phosphatase: 72 U/L (ref 39–117)
BUN: 10 mg/dL (ref 6–23)
CALCIUM: 9.1 mg/dL (ref 8.4–10.5)
CHLORIDE: 101 meq/L (ref 96–112)
CO2: 25 mEq/L (ref 19–32)
Creatinine, Ser: 0.61 mg/dL (ref 0.50–1.10)
GFR calc non Af Amer: >90 mL/min (ref >90)
GLUCOSE: 103 mg/dL — AB (ref 70–99)
Potassium: 4.3 mEq/L (ref 3.7–5.3)
Sodium: 139 mEq/L (ref 137–147)
Total Bilirubin: 0.5 mg/dL (ref 0.3–1.2)
Total Protein: 7.3 g/dL (ref 6.0–8.3)

## 2014-06-10 LAB — CBC
HCT: 39.1 % (ref 36.0–46.0)
Hemoglobin: 13.5 g/dL (ref 12.0–15.0)
MCH: 31.5 pg (ref 26.0–34.0)
MCHC: 34.5 g/dL (ref 30.0–36.0)
MCV: 91.4 fL (ref 78.0–100.0)
Platelets: 309 10*3/uL (ref 150–400)
RBC: 4.28 MIL/uL (ref 3.87–5.11)
RDW: 11.9 % (ref 11.5–15.5)
WBC: 7 10*3/uL (ref 4.0–10.5)

## 2014-06-10 LAB — URINE MICROSCOPIC-ADD ON

## 2014-06-10 LAB — URINALYSIS, ROUTINE W REFLEX MICROSCOPIC
Bilirubin Urine: NEGATIVE
GLUCOSE, UA: NEGATIVE mg/dL
Hgb urine dipstick: NEGATIVE
Ketones, ur: NEGATIVE mg/dL
Nitrite: NEGATIVE
PROTEIN: NEGATIVE mg/dL
Specific Gravity, Urine: 1.017 (ref 1.005–1.030)
UROBILINOGEN UA: 1 mg/dL (ref 0.0–1.0)
pH: 8 (ref 5.0–8.0)

## 2014-06-10 LAB — RAPID STREP SCREEN (MED CTR MEBANE ONLY): STREPTOCOCCUS, GROUP A SCREEN (DIRECT): NEGATIVE

## 2014-06-10 LAB — PREGNANCY, URINE: PREG TEST UR: NEGATIVE

## 2014-06-10 LAB — TROPONIN I

## 2014-06-10 MED ORDER — CEPHALEXIN 250 MG PO CAPS: 500.0000 mg | ORAL_CAPSULE | Freq: Two times a day (BID) | ORAL | Status: DC

## 2014-06-10 NOTE — ED Notes (Signed)
Pt alert, arrives from home, c/o fever, cough, UTI s/s, onset was a week ago, attempted to bed seen in Urgent Care, instructed to come to ED, denies taking any medication other than "thera-flu", resp even unlabored

## 2014-06-10 NOTE — Discharge Instructions (Signed)
Please call your doctor for a followup appointment within 24-48 hours. When you talk to your doctor please let them know that you were seen in the emergency department and have them acquire all of your records so that they can discuss the findings with you and formulate a treatment plan to fully care for your new and ongoing problems. Please call and set-up an appointment with your primary care provider to be seen and assessed Please rest and stay hydrated Please take medications as prescribed and on a full stomach  Please continue to monitor symptoms closely and if symptoms are to worsen or change (fever greater than 101, chills, sweating, nausea, vomiting, chest pain, shortness of breathe, difficulty breathing, weakness, numbness, tingling, worsening or changes to pain pattern, fall, injury, coughing up blood, vomiting up blood, dizziness, visual changes) please report back to the Emergency Department immediately.   Viral Infections A virus is a type of germ. Viruses can cause:  Minor sore throats.  Aches and pains.  Headaches.  Runny nose.  Rashes.  Watery eyes.  Tiredness.  Coughs.  Loss of appetite.  Feeling sick to your stomach (nausea).  Throwing up (vomiting).  Watery poop (diarrhea). HOME CARE   Only take medicines as told by your doctor.  Drink enough water and fluids to keep your pee (urine) clear or pale yellow. Sports drinks are a good choice.  Get plenty of rest and eat healthy. Soups and broths with crackers or rice are fine. GET HELP RIGHT AWAY IF:   You have a very bad headache.  You have shortness of breath.  You have chest pain or neck pain.  You have an unusual rash.  You cannot stop throwing up.  You have watery poop that does not stop.  You cannot keep fluids down.  You or your child has a temperature by mouth above 102 F (38.9 C), not controlled by medicine.  Your baby is older than 3 months with a rectal temperature of 102 F (38.9  C) or higher.  Your baby is 49 months old or younger with a rectal temperature of 100.4 F (38 C) or higher. MAKE SURE YOU:   Understand these instructions.  Will watch this condition.  Will get help right away if you are not doing well or get worse. Document Released: 05/25/2008 Document Revised: 09/04/2011 Document Reviewed: 10/18/2010 [facility] Patient Information 2015 [place], [place]. This information is not intended to replace advice given to you by your health care provider. Make sure you discuss any questions you have with your health care provider. Upper Respiratory Infection, Adult An upper respiratory infection (URI) is also sometimes known as the common cold. The upper respiratory tract includes the nose, sinuses, throat, trachea, and bronchi. Bronchi are the airways leading to the lungs. Most people improve within 1 week, but symptoms can last up to 2 weeks. A residual cough may last even longer.  CAUSES Many different viruses can infect the tissues lining the upper respiratory tract. The tissues become irritated and inflamed and often become very moist. Mucus production is also common. A cold is contagious. You can easily spread the virus to others by oral contact. This includes kissing, sharing a glass, coughing, or sneezing. Touching your mouth or nose and then touching a surface, which is then touched by another person, can also spread the virus. SYMPTOMS  Symptoms typically develop 1 to 3 days after you come in contact with a cold virus. Symptoms vary from person to person. They may include:  Runny nose.  Sneezing.  Nasal congestion.  Sinus irritation.  Sore throat.  Loss of voice (laryngitis).  Cough.  Fatigue.  Muscle aches.  Loss of appetite.  Headache.  Low-grade fever. DIAGNOSIS  You might diagnose your own cold based on familiar symptoms, since most people get a cold 2 to 3 times a year. Your caregiver can confirm this based on your exam. Most  importantly, your caregiver can check that your symptoms are not due to another disease such as strep throat, sinusitis, pneumonia, asthma, or epiglottitis. Blood tests, throat tests, and X-rays are not necessary to diagnose a common cold, but they may sometimes be helpful in excluding other more serious diseases. Your caregiver will decide if any further tests are required. RISKS AND COMPLICATIONS  You may be at risk for a more severe case of the common cold if you smoke cigarettes, have chronic heart disease (such as heart failure) or lung disease (such as asthma), or if you have a weakened immune system. The very young and very old are also at risk for more serious infections. Bacterial sinusitis, middle ear infections, and bacterial pneumonia can complicate the common cold. The common cold can worsen asthma and chronic obstructive pulmonary disease (COPD). Sometimes, these complications can require emergency medical care and may be life-threatening. PREVENTION  The best way to protect against getting a cold is to practice good hygiene. Avoid oral or hand contact with people with cold symptoms. Wash your hands often if contact occurs. There is no clear evidence that vitamin C, vitamin E, echinacea, or exercise reduces the chance of developing a cold. However, it is always recommended to get plenty of rest and practice good nutrition. TREATMENT  Treatment is directed at relieving symptoms. There is no cure. Antibiotics are not effective, because the infection is caused by a virus, not by bacteria. Treatment may include:  Increased fluid intake. Sports drinks offer valuable electrolytes, sugars, and fluids.  Breathing heated mist or steam (vaporizer or shower).  Eating chicken soup or other clear broths, and maintaining good nutrition.  Getting plenty of rest.  Using gargles or lozenges for comfort.  Controlling fevers with ibuprofen or acetaminophen as directed by your caregiver.  Increasing  usage of your inhaler if you have asthma. Zinc gel and zinc lozenges, taken in the first 24 hours of the common cold, can shorten the duration and lessen the severity of symptoms. Pain medicines may help with fever, muscle aches, and throat pain. A variety of non-prescription medicines are available to treat congestion and runny nose. Your caregiver can make recommendations and may suggest nasal or lung inhalers for other symptoms.  HOME CARE INSTRUCTIONS   Only take over-the-counter or prescription medicines for pain, discomfort, or fever as directed by your caregiver.  Use a warm mist humidifier or inhale steam from a shower to increase air moisture. This may keep secretions moist and make it easier to breathe.  Drink enough water and fluids to keep your urine clear or pale yellow.  Rest as needed.  Return to work when your temperature has returned to normal or as your caregiver advises. You may need to stay home longer to avoid infecting others. You can also use a face mask and careful hand washing to prevent spread of the virus. SEEK MEDICAL CARE IF:   After the first few days, you feel you are getting worse rather than better.  You need your caregiver's advice about medicines to control symptoms.  You develop chills, worsening shortness  of breath, or brown or red sputum. These may be signs of pneumonia.  You develop yellow or brown nasal discharge or pain in the face, especially when you bend forward. These may be signs of sinusitis.  You develop a fever, swollen neck glands, pain with swallowing, or white areas in the back of your throat. These may be signs of strep throat. SEEK IMMEDIATE MEDICAL CARE IF:   You have a fever.  You develop severe or persistent headache, ear pain, sinus pain, or chest pain.  You develop wheezing, a prolonged cough, cough up blood, or have a change in your usual mucus (if you have chronic lung disease).  You develop sore muscles or a stiff  neck. Document Released: 12/06/2000 Document Revised: 09/04/2011 Document Reviewed: 09/17/2013 [facility] Patient Information 2015 [place], [place]. This information is not intended to replace advice given to you by your health care provider. Make sure you discuss any questions you have with your health care provider. Urinary Tract Infection Urinary tract infections (UTIs) can develop anywhere along your urinary tract. Your urinary tract is your body's drainage system for removing wastes and extra water. Your urinary tract includes two kidneys, two ureters, a bladder, and a urethra. Your kidneys are a pair of bean-shaped organs. Each kidney is about the size of your fist. They are located below your ribs, one on each side of your spine. CAUSES Infections are caused by microbes, which are microscopic organisms, including fungi, viruses, and bacteria. These organisms are so small that they can only be seen through a microscope. Bacteria are the microbes that most commonly cause UTIs. SYMPTOMS  Symptoms of UTIs may vary by age and gender of the patient and by the location of the infection. Symptoms in young women typically include a frequent and intense urge to urinate and a painful, burning feeling in the bladder or urethra during urination. Older women and men are more likely to be tired, shaky, and weak and have muscle aches and abdominal pain. A fever may mean the infection is in your kidneys. Other symptoms of a kidney infection include pain in your back or sides below the ribs, nausea, and vomiting. DIAGNOSIS To diagnose a UTI, your caregiver will ask you about your symptoms. Your caregiver also will ask to provide a urine sample. The urine sample will be tested for bacteria and white blood cells. White blood cells are made by your body to help fight infection. TREATMENT  Typically, UTIs can be treated with medication. Because most UTIs are caused by a bacterial infection, they usually can be treated  with the use of antibiotics. The choice of antibiotic and length of treatment depend on your symptoms and the type of bacteria causing your infection. HOME CARE INSTRUCTIONS  If you were prescribed antibiotics, take them exactly as your caregiver instructs you. Finish the medication even if you feel better after you have only taken some of the medication.  Drink enough water and fluids to keep your urine clear or pale yellow.  Avoid caffeine, tea, and carbonated beverages. They tend to irritate your bladder.  Empty your bladder often. Avoid holding urine for long periods of time.  Empty your bladder before and after sexual intercourse.  After a bowel movement, women should cleanse from front to back. Use each tissue only once. SEEK MEDICAL CARE IF:   You have back pain.  You develop a fever.  Your symptoms do not begin to resolve within 3 days. SEEK IMMEDIATE MEDICAL CARE IF:  You have severe back pain or lower abdominal pain.  You develop chills.  You have nausea or vomiting.  You have continued burning or discomfort with urination. MAKE SURE YOU:   Understand these instructions.  Will watch your condition.  Will get help right away if you are not doing well or get worse. Document Released: 03/22/2005 Document Revised: 12/12/2011 Document Reviewed: 07/21/2011 [facility] Patient Information 2015 [place], [place]. This information is not intended to replace advice given to you by your health care provider. Make sure you discuss any questions you have with your health care provider.   Emergency Department Resource Guide 1) Find a Doctor and Pay Out of Pocket Although you won't have to find out who is covered by your insurance plan, it is a good idea to ask around and get recommendations. You will then need to call the office and see if the doctor you have chosen will accept you as a new patient and what types of options they offer for patients who are self-pay. Some doctors  offer discounts or will set up payment plans for their patients who do not have insurance, but you will need to ask so you aren't surprised when you get to your appointment.  2) Contact Your Local Health Department Not all health departments have doctors that can see patients for sick visits, but many do, so it is worth a call to see if yours does. If you don't know where your local health department is, you can check in your phone book. The CDC also has a tool to help you locate your state's health department, and many state websites also have listings of all of their local health departments.  3) Find a Walk-in Clinic If your illness is not likely to be very severe or complicated, you may want to try a walk in clinic. These are popping up all over the country in pharmacies, drugstores, and shopping centers. They're usually staffed by nurse practitioners or physician assistants that have been trained to treat common illnesses and complaints. They're usually fairly quick and inexpensive. However, if you have serious medical issues or chronic medical problems, these are probably not your best option.  No Primary Care Doctor: - Call Health Connect at  [phone] - they can help you locate a primary care doctor that  accepts your insurance, provides certain services, etc. - Physician Referral Service- [phone]  Chronic Pain Problems: Organization         Address  Phone   Notes  [facility]  [phone] Patients need to be referred by their primary care doctor.   Medication Assistance: Organization         Address  Phone   Notes  [facility] [address] [place], [place] [address] [phone] --Must be a resident of [facility] -- Must have NO insurance coverage whatsoever (no Medicaid/ Medicare, etc.) -- The pt. MUST have a primary care doctor that directs their care regularly and follows them in the community    [facility]  [phone]   [employer]  [phone]    Agencies that provide inexpensive medical care: Organization         Address  Phone   Notes  [facility]  [phone]   [facility]    [phone]   [facility] [address] [place], [place] [address] [phone]   [facility]  Lovenia Shuck, [place] [phone]   Planned Parenthood    [phone]   [facility]    [phone]   [facility]  [address], [place] Phone:  [phone], Fax:  [phone] Hours of Operation:  9 am - 6 pm, M-F.  Also accepts Medicaid/Medicare and self-pay.  [facility]  [address], [place] Phone: [phone], Fax: [phone]. Hours of Operation:  8:30 am - 5:30 pm, M-F.  Also accepts Medicaid and self-pay.  [facility] [place] [address] [place] Phone: [phone]   [facility] [address] Natasha Bence [place], [place] [phone], Ext. [phone] Mondays & Thursdays: 7-9 AM.  First 15 patients are seen on a first come, first serve basis.    Medicaid-accepting [facility] Providers:  Organization         Address  Phone   Notes  [facility] [address], [place] [phone] Also accepts self-pay patients.  [facility] [address] Laurell Josephs [place], [place]  [phone]   [facility] [address], [place] [phone]   [facility] [address], [place] [phone]   Renaye Rakers [address], [place]   [phone] Only accepts [place] Access [place] patients after they have their name applied to their card.   Self-Pay (no insurance) in [facility]:  Organization         Address  Phone   Notes  Sickle Cell Patients, [facility]  [facility] [address] [place], [place] [phone]   [facility] [address] [place], [place] [phone]   [facility] [place]  [address], [place] [phone]   [facility]/Dr. Osei-Bonsu  [address], [place] or [address], [place] [phone] Phone number for both [place] and [place] locations is the same.  [facility] [address] [place] [phone]   [facility] [address] [place] or [address] [phone]   [facility] [address], [place] [phone], phone; [phone], fax Sees patients 1st and 3rd Saturday of every month.  Must not qualify for public or private insurance (i.e. Medicaid, Medicare, [place] Health Choice, Veterans' Benefits)  Household income should be no more than 200% of the poverty level The clinic cannot treat you if you are pregnant or think you are pregnant  Sexually transmitted diseases are not treated at the clinic.    Dental Care: Organization         Address  Phone  Notes  [facility] [address] [place], [place] [phone] Accepts children up to age 42 who are enrolled in [place] or [place] Health Choice; pregnant women with a Medicaid card; and children who have applied for Medicaid or [place] Health Choice, but were declined, whose parents can pay a reduced fee at time of service.  [facility]  [address], [place] [phone] Accepts children up to age 5 who are enrolled in [place] or  Health Choice; pregnant women with a Medicaid card; and children who have applied for Medicaid  or [place] Health Choice, but were declined, whose parents can pay a reduced fee at time of service.  [facility]  [address], [place] [phone] Patients are seen by appointment only. Walk-ins are not accepted. [facility] will see patients 35 years of age and older. Monday - Tuesday (8am-5pm) Most Wednesdays (8:30-5pm) $30 per visit, cash only  [facility]  [address], [facility] [phone] Patients are seen by appointment only. Walk-ins are not accepted. [facility] will see patients 35 years of age and older. One Wednesday Evening (Monthly: Volunteer Based).  $30 per visit, cash only  [facility]  [phone] for adults; Children under age 144, call Graduate Pediatric Dentistry at [phone]. Children aged 34-14, please call [phone] to request a pediatric application.  Dental services are provided in all areas of dental care including fillings, crowns and bridges, complete and partial dentures, implants, gum treatment, root canals, and extractions. Preventive care is also provided. Treatment is provided to both adults and children. Patients are selected via a lottery and there is often a waiting list.   [facility] [address], [place]  [phone] [email or web address]   [facility] [address], [place], [place] [phone], Ext. [phone] Second and Fourth Thursday of each month, opens at 6:30 AM; Clinic ends at 9 AM.  Patients are seen on a first-come first-served basis, and a limited number are seen during each clinic.   [facility]  [address], [place], [place] [phone]   Eligibility Requirements You must have lived in [place], or [place] for at least the last three months.   You cannot be eligible for state or federal sponsored [place]healthcare insurance, including [place]IG[place]eterans Administration, [place], or [employer]Medicare.   You generally cannot be eligible for healthcare insurance through your employer.    How to apply: Eligibility screenings are held every Tuesday and  Wednesday afternoon from 1:00 pm until 4:00 pm. You do not need an appointment for the interview!  [facility] [address], [place], [place] [phone]   [facility]  [phone]   [facility]  [phone]   [facility]  [phone]    Behavioral Health Resources in the Community: Intensive Outpatient Programs Organization         Address  Phone  Notes  [facility] [address], [place], [place] [phone]   [facility] [address], [place], [place] [phone]   [facility] [address], [place], [place]  [phone]   [facility] [address],  [place], [place] [phone] or [phone]   Substance Abuse Resources Organization         Address  Phone  Notes  [facility]  [phone]   [facility]  [phone]   [facility]  [phone]   [facility]  [phone]   Residential & Outpatient Substance Abuse Program  [phone]   Psychological Services Organization         Address  Phone  Notes  [facility]  [phone]- [phone]   [facility]  [phone]- [phone]   [facility] [address], [place] [phone] or [phone]    Mobile Crisis Teams Organization         Address  Phone  Notes  [facility], Mobile Crisis Care Unit  [phone]   [facility]  [address] [place], [place] [phone]   [facility] [address] [place] [phone]    Self-Help/Support Groups Organization         Address  Phone             Notes  [facility] [place] - variety of support groups  [phone]- I7437963 Call for more information  Narcotics Anonymous (NA), [facility] [address], [employer] [place]  2 meetings at this location   Risk manager         Address  Phone  Notes  [facility] [address],    [place]  [phone]   [facility]  [address] [place] [address], [place], [place] [phone]   [facility] [address] [place], [place] [phone] Admissions: 8am-3pm M-F  [facility] [address],    [place], [place] [phone]   [facility] [address] [place], [place], [place] [phone]   [facility] [address],  [place], [place] [phone]   [facility] - Intensive Outpatient [address]ure[address] Josephs [address], [place], [place] [phone]   [facility] ([facility] [address] [place]  [place], [place] [phone] or [phone]   [facility] ([facility]) [address] [place], [place] [phone] Accepts Medicaid  [facility] [address],  [place] [phone] Substance Abuse/Addiction Treatment   [facility] [facility]         Address  Phone  Notes  [facility]  [phone]   Angie Fava, PhD [address] [place]vin Knack [place], [place]   [phone] or [phone]   [facility]   [address] [place], [place] [phone]   [facility] [address] [place], [place] [phone] Insurance/Medicaid/sponsorship through [facility] [address]                                    [place], [place] [phone] Therapy/tele-psych/case  [facility] [address], [place] [phone]    Dr. Lolly Mustache  [phone]   [facility]  [employer] [facility] Dept. 1) [address] [place] 2) [address], [place] 3)  [address], [place] [phone]  [phone]   [facility] Child Abuse Hotline [phone] or [phone] (After Hours)

## 2014-06-10 NOTE — ED Notes (Signed)
Pt requesting to speak to PA prior to doing EKG

## 2014-06-10 NOTE — ED Provider Notes (Signed)
CSN: [id number]     Arrival date & time 06/10/14  1217 History  This chart was scribed for non-physician practitioner, Raymon MuttonMarissa Trigger Frasier, PA-C, working with Toy BakerAnthony T Allen, MD, by Abel PrestoKara Demonbreun, ED Scribe. This patient was seen in room WTR7/WTR7 and the patient's care was started at 2:00 PM.    Chief Complaint  Patient presents with  . Fever    [facility].    Patient is a 33 y.o. female presenting with fever. The history is provided by the patient. No language interpreter was used.  Fever Associated symptoms: chest pain (with cough), cough, headaches, myalgias, nausea and sore throat   Associated symptoms: no congestion, no diarrhea and no vomiting     HPI Comments: Emily Woodard is a 33 y.o. female with PMHx of fibromyalgia who presents to the Emergency Department complaining of cough with onset 1 week ago. Pt notes associated body aches, subjective fever, sweating, dry cough, sore throat (onset today), chest tightness and pain with cough, difficulty breathing. Pt notes she cannot distinguish the pain from cough from her fibromyalgia. Pt was recently in contact with a sick coworker who also had a cough. Pt has taken theraflu for relief. Stated that she has been experiencing urgency and mild dysuria a couple of days ago. Pt is a former smoker. Denied, dizziness, blurred vision, sudden loss of vision, jaw pain, neck pain, neck stiffness, hematemesis, nasal congestion, vomiting, diarrhea, melena, hematochezia, abdominal pain, vaginal pain, vaginal bleeding, vaginal discharge and pelvic pain, syncope. Denied birth control use. PCP Dr. Loleta ChanceHill   Past Medical History  Diagnosis Date  . Abortion in first trimester 03/2000  . Depression   . Anxiety     hx panic disorder  . Asthma     no prob as adult - no inhaler  . Recurrent upper respiratory infection (URI)     05/14/2011 - tx with otc  . Seizures     last one 2002 - no meds  .  Fibromyalgia    Past Surgical History  Procedure Laterality Date  . Dilation and evacuation  12/2001  . Colposcopy    . Wisdom tooth extraction    . Svd   07/2004, 08/2009    x 2  . Laparoscopic tubal ligation  05/23/2011    Procedure: LAPAROSCOPIC TUBAL LIGATION;  Surgeon: Zenaida Nieceodd D Meisinger, MD;  Location: [facility];  Service: Gynecology;  Laterality: Bilateral;  . Iud removal  05/23/2011    Procedure: INTRAUTERINE DEVICE (IUD) REMOVAL;  Surgeon: Zenaida Nieceodd D Meisinger, MD;  Location: [facility];  Service: Gynecology;;  . Endometrial ablation  05/23/2011    Procedure: ENDOMETRIAL ABLATION;  Surgeon: Zenaida Nieceodd D Meisinger, MD;  Location: [facility];  Service: Gynecology;;   Family History  Problem Relation Age of Onset  . Diabetes Mother   . Hyperlipidemia Mother   . Hypertension Mother    History  Substance Use Topics  . Smoking status: Former Smoker -- 1.00 packs/day for 6 years    Types: Cigarettes    Quit date: 06/26/2006  . Smokeless tobacco: Never Used  . Alcohol Use: No   OB History    No data available     Review of Systems  Constitutional: Positive for fever (subjective) and diaphoresis.  HENT: Positive for sore throat. Negative for congestion and trouble swallowing.   Eyes: Negative for visual disturbance.  Respiratory: Positive for cough and shortness of breath.   Cardiovascular: Positive for chest pain (  with cough).  Gastrointestinal: Positive for nausea. Negative for vomiting, diarrhea and blood in stool.  Genitourinary: Positive for urgency. Negative for hematuria, vaginal bleeding and vaginal discharge.  Musculoskeletal: Positive for myalgias.  Neurological: Positive for weakness and headaches. Negative for dizziness.      Allergies  Review of patient's allergies indicates no known allergies.  Home Medications   Prior to Admission medications   Medication Sig Start Date End Date Taking? Authorizing Provider  buprenorphine-naloxone (SUBOXONE) 8-2 MG SUBL SL tablet Place 1  tablet under the tongue 2 (two) times daily.    Historical Provider, MD  cephALEXin (KEFLEX) 250 MG capsule Take 2 capsules (500 mg total) by mouth 2 (two) times daily. 06/10/14   Carole Deere, PA-C  clonazePAM (KLONOPIN) 0.5 MG tablet Take 0.5 mg by mouth 3 (three) times daily as needed for anxiety.     Historical Provider, MD  DULoxetine (CYMBALTA) 60 MG capsule Take 60 mg by mouth 2 (two) times daily.    Historical Provider, MD  ibuprofen (ADVIL,MOTRIN) 200 MG tablet Take 800 mg by mouth every 6 (six) hours as needed for pain.    Historical Provider, MD   BP 109/65 mmHg  Pulse 82  Temp(Src) 98.3 F (36.8 C) (Oral)  Resp 14  Wt 160 lb (72.576 kg)  SpO2 99%  LMP 06/07/2014 (Exact Date) Physical Exam  Constitutional: She is oriented to person, place, and time. She appears well-developed and well-nourished. No distress.  HENT:  Head: Normocephalic and atraumatic.  Mouth/Throat: No oropharyngeal exudate.  Mild erythema identified to the posterior oropharynx with negative bilateral tonsillar adenopathy/erythema, exudate, petechiae, pustules or ulcers. Negative trismus. Uvula midline with symmetrical elevation-negative uvula swelling.  Eyes: Conjunctivae and EOM are normal. Pupils are equal, round, and reactive to light. Right eye exhibits no discharge. Left eye exhibits no discharge.  Neck: Normal range of motion. Neck supple. No tracheal deviation present.  Negative neck stiffness Negative nuchal rigidity Negative cervical lymphadenopathy Negative meningeal signs  Cardiovascular: Normal rate, regular rhythm and normal heart sounds.  Exam reveals no friction rub.   No murmur heard. Pulses:      Radial pulses are 2+ on the right side, and 2+ on the left side.  Pulmonary/Chest: Effort normal and breath sounds normal. No respiratory distress. She has no wheezes. She has no rales. She exhibits tenderness.  Patient is able to speak in full sentences without difficulty Negative use of  accessory muscles Negative stridor  Pain upon palpation to the chest wall - appears to be muscular in nature.   Musculoskeletal: Normal range of motion.  Full ROM to upper and lower extremities without difficulty noted, negative ataxia noted.  Lymphadenopathy:    She has no cervical adenopathy.  Neurological: She is alert and oriented to person, place, and time. No cranial nerve deficit. She exhibits normal muscle tone. Coordination normal.  Cranial nerves III-XII grossly intact Fine motor skills intact Gait proper, proper balance - negative sway, negative drift, negative step-offs  Skin: Skin is warm and dry. No rash noted. She is not diaphoretic. No erythema.  Psychiatric: She has a normal mood and affect. Her behavior is normal. Thought content normal.  Nursing note and vitals reviewed.   ED Course  Procedures (including critical care time) DIAGNOSTIC STUDIES: Oxygen Saturation is 99% on room air, normal by my interpretation.    COORDINATION OF CARE: 2:08 PM Discussed treatment plan with patient at beside, the patient agrees with the plan and has no further questions at this  time.    Results for orders placed or performed during the hospital encounter of 06/10/14  Rapid strep screen  Result Value Ref Range   Streptococcus, Group A Screen (Direct) NEGATIVE NEGATIVE  Comprehensive metabolic panel (if pt has a temp above 100.62F)  Result Value Ref Range   Sodium 139 137 - 147 mEq/L   Potassium 4.3 3.7 - 5.3 mEq/L   Chloride 101 96 - 112 mEq/L   CO2 25 19 - 32 mEq/L   Glucose, Bld 103 (H) 70 - 99 mg/dL   BUN 10 6 - 23 mg/dL   Creatinine, Ser 1.610.61 0.50 - 1.10 mg/dL   Calcium 9.1 8.4 - 09.610.5 mg/dL   Total Protein 7.3 6.0 - 8.3 g/dL   Albumin 3.7 3.5 - 5.2 g/dL   AST 87 (H) 0 - 37 U/L   ALT 65 (H) 0 - 35 U/L   Alkaline Phosphatase 72 39 - 117 U/L   Total Bilirubin 0.5 0.3 - 1.2 mg/dL   GFR calc non Af Amer >90 >90 mL/min   GFR calc Af Amer >90 >90 mL/min   Anion gap 13 5 -  15  Urinalysis, Routine w reflex microscopic (if pt has temp above 100.62F)  Result Value Ref Range   Color, Urine YELLOW YELLOW   APPearance CLOUDY (A) CLEAR   Specific Gravity, Urine 1.017 1.005 - 1.030   pH 8.0 5.0 - 8.0   Glucose, UA NEGATIVE NEGATIVE mg/dL   Hgb urine dipstick NEGATIVE NEGATIVE   Bilirubin Urine NEGATIVE NEGATIVE   Ketones, ur NEGATIVE NEGATIVE mg/dL   Protein, ur NEGATIVE NEGATIVE mg/dL   Urobilinogen, UA 1.0 0.0 - 1.0 mg/dL   Nitrite NEGATIVE NEGATIVE   Leukocytes, UA MODERATE (A) NEGATIVE  Pregnancy, urine  Result Value Ref Range   Preg Test, Ur NEGATIVE NEGATIVE  CBC  Result Value Ref Range   WBC 7.0 4.0 - 10.5 K/uL   RBC 4.28 3.87 - 5.11 MIL/uL   Hemoglobin 13.5 12.0 - 15.0 g/dL   HCT 04.539.1 40.936.0 - 81.146.0 %   MCV 91.4 78.0 - 100.0 fL   MCH 31.5 26.0 - 34.0 pg   MCHC 34.5 30.0 - 36.0 g/dL   RDW 91.411.9 78.211.5 - 95.615.5 %   Platelets 309 150 - 400 K/uL  Troponin I  Result Value Ref Range   Troponin I <0.30 <0.30 ng/mL  Urine microscopic-add on  Result Value Ref Range   Squamous Epithelial / LPF MANY (A) RARE   WBC, UA 11-20 <3 WBC/hpf   RBC / HPF 0-2 <3 RBC/hpf   Bacteria, UA MANY (A) RARE    Labs Review Labs Reviewed  COMPREHENSIVE METABOLIC PANEL - Abnormal; Notable for the following:    Glucose, Bld 103 (*)    AST 87 (*)    ALT 65 (*)    All other components within normal limits  URINALYSIS, ROUTINE W REFLEX MICROSCOPIC - Abnormal; Notable for the following:    APPearance CLOUDY (*)    Leukocytes, UA MODERATE (*)    All other components within normal limits  URINE MICROSCOPIC-ADD ON - Abnormal; Notable for the following:    Squamous Epithelial / LPF MANY (*)    Bacteria, UA MANY (*)    All other components within normal limits  RAPID STREP SCREEN  URINE CULTURE  CULTURE, GROUP A STREP  PREGNANCY, URINE  CBC  TROPONIN I    Imaging Review Dg Chest 2 View  06/10/2014   CLINICAL DATA:  Cough, fever and  congestion for 8 days. History of  asthma.  EXAM: CHEST  2 VIEW  COMPARISON:  08/02/2008  FINDINGS: Slightly decreased lung volumes compared to the previous examination. No focal lung disease. Heart and mediastinum are within normal limits. Bony structures are unremarkable.  IMPRESSION: No active cardiopulmonary disease.   Electronically Signed   By: Richarda Overlie M.D.   On: 06/10/2014 13:26     EKG Interpretation None      MDM   Final diagnoses:  URI (upper respiratory infection)  Viral infection  UTI (lower urinary tract infection)   Medications - No data to display  Filed Vitals:   06/10/14 1232 06/10/14 1521  BP: 122/72 109/65  Pulse: 74 82  Temp: 98.3 F (36.8 C)   TempSrc: Oral   Resp: 16 14  Weight: 160 lb (72.576 kg)   SpO2: 99% 99%   I personally performed the services described in this documentation, which was scribed in my presence. The recorded information has been reviewed and is accurate.  EKG noted normal sinus rhythm with a heart rate of 81 bpm. Troponin negative elevation. CBC unremarkable-negative elevated leukocytosis. CMP unremarkable-glucose 103, negative elevated anion gap of 13.0 mg/L. Urine pregnancy negative. Urinalysis noted moderate leukocytes with white blood cell count of 11-10 with many squamous cells and many bacteria-urine culture pending. Rapid strep test negative. Chest x-ray no acute cardiopulmonary disease noted. Doubt pneumonia. Doubt meningitis. Doubt PE - negative birth control, negative tachycardia/tachypnea. Patient presenting to the ED with UTI-will treat with antibiotics. Suspicion to be viral infection, upper respiratory infection. Doubt ACS with over a week of chest discomfort with negative findings or changes noted on EKG and negative elevated troponin - appears to be muscular in nature - pain upon palpation. Patient stable, afebrile. Patient not septic appearing. Negative signs of respiratory distress. Discharged patient. Discharged patient with antibiotics. Discussed with  patient to rest and stay hydrated. Referred patient to PCP. Discussed with patient to closely monitor symptoms and if symptoms are to worsen or change to report back to the ED - strict return instructions given.  Patient agreed to plan of care, understood, all questions answered.  Raymon Mutton, PA-C 06/10/14 1619  Raymon Mutton, PA-C 06/10/14 1625  Toy Baker, MD 06/11/14 [phone]

## 2014-06-13 LAB — URINE CULTURE
Colony Count: >=100000
Special Requests: NORMAL

## 2014-06-14 ENCOUNTER — Telehealth (HOSPITAL_COMMUNITY): Payer: Self-pay

## 2014-06-14 LAB — CULTURE, GROUP A STREP

## 2014-06-14 NOTE — Telephone Encounter (Signed)
Post ED Visit - Positive Culture Follow-up  Culture report reviewed by antimicrobial stewardship pharmacist: [x]  Wes Dulaney, Pharm.D., BCPS []  Celedonio MiyamotoJeremy Frens, Pharm.D., BCPS []  Georgina PillionElizabeth Martin, Pharm.D., BCPS []  Badger LeeMinh Pham, [address]Ph[address]D., BCPS, AAHIVP []  Estella HuskMichelle Turner, Pharm.D., BCPS, AAHIVP []  Elder CyphersLorie Poole, [address]Pharm.D., BCPS  Positive Urine culture, >/= 100,000 colonies -> E Coli Treated with Cephalexin, organism sensitive to the same and no further patient follow-up is required at this time.  Arvid RightClark, Maebel Marasco Dorn 06/14/2014, 8:56 PM

## 2014-09-08 ENCOUNTER — Emergency Department (HOSPITAL_COMMUNITY)
Admission: EM | Admit: 2014-09-08 | Discharge: 2014-09-08 | Disposition: A | Discharge status: Home / Self Care | Payer: 59 | Attending: Emergency Medicine | Admitting: Emergency Medicine

## 2014-09-08 ENCOUNTER — Encounter (HOSPITAL_COMMUNITY): Payer: Self-pay | Admitting: Emergency Medicine

## 2014-09-08 DIAGNOSIS — Y998 Other external cause status: Secondary | ICD-10-CM | POA: Diagnosis not present

## 2014-09-08 DIAGNOSIS — W1830XA Fall on same level, unspecified, initial encounter: Secondary | ICD-10-CM | POA: Insufficient documentation

## 2014-09-08 DIAGNOSIS — Z79899 Other long term (current) drug therapy: Secondary | ICD-10-CM | POA: Insufficient documentation

## 2014-09-08 DIAGNOSIS — W19XXXA Unspecified fall, initial encounter: Secondary | ICD-10-CM

## 2014-09-08 DIAGNOSIS — Y9289 Other specified places as the place of occurrence of the external cause: Secondary | ICD-10-CM | POA: Insufficient documentation

## 2014-09-08 DIAGNOSIS — J45909 Unspecified asthma, uncomplicated: Secondary | ICD-10-CM | POA: Diagnosis not present

## 2014-09-08 DIAGNOSIS — Y9301 Activity, walking, marching and hiking: Secondary | ICD-10-CM | POA: Diagnosis not present

## 2014-09-08 DIAGNOSIS — Z8669 Personal history of other diseases of the nervous system and sense organs: Secondary | ICD-10-CM | POA: Insufficient documentation

## 2014-09-08 DIAGNOSIS — S199XXA Unspecified injury of neck, initial encounter: Secondary | ICD-10-CM | POA: Insufficient documentation

## 2014-09-08 DIAGNOSIS — Z792 Long term (current) use of antibiotics: Secondary | ICD-10-CM | POA: Insufficient documentation

## 2014-09-08 DIAGNOSIS — S3992XA Unspecified injury of lower back, initial encounter: Secondary | ICD-10-CM | POA: Insufficient documentation

## 2014-09-08 DIAGNOSIS — F329 Major depressive disorder, single episode, unspecified: Secondary | ICD-10-CM | POA: Insufficient documentation

## 2014-09-08 DIAGNOSIS — S4990XA Unspecified injury of shoulder and upper arm, unspecified arm, initial encounter: Secondary | ICD-10-CM | POA: Diagnosis not present

## 2014-09-08 DIAGNOSIS — S79919A Unspecified injury of unspecified hip, initial encounter: Secondary | ICD-10-CM | POA: Insufficient documentation

## 2014-09-08 DIAGNOSIS — F419 Anxiety disorder, unspecified: Secondary | ICD-10-CM | POA: Insufficient documentation

## 2014-09-08 DIAGNOSIS — Z87891 Personal history of nicotine dependence: Secondary | ICD-10-CM | POA: Insufficient documentation

## 2014-09-08 DIAGNOSIS — Z791 Long term (current) use of non-steroidal anti-inflammatories (NSAID): Secondary | ICD-10-CM | POA: Diagnosis not present

## 2014-09-08 LAB — BASIC METABOLIC PANEL
Anion gap: 6 (ref 5–15)
BUN: 12 mg/dL (ref 6–23)
CO2: 26 mmol/L (ref 19–32)
Calcium: 8.6 mg/dL (ref 8.4–10.5)
Chloride: 106 mmol/L (ref 96–112)
Creatinine, Ser: 0.65 mg/dL (ref 0.50–1.10)
GFR calc Af Amer: >90 mL/min (ref >90)
GFR calc non Af Amer: >90 mL/min (ref >90)
GLUCOSE: 136 mg/dL — AB (ref 70–99)
POTASSIUM: 4 mmol/L (ref 3.5–5.1)
Sodium: 138 mmol/L (ref 135–145)

## 2014-09-08 LAB — CBC
HCT: 39.8 % (ref 36.0–46.0)
Hemoglobin: 13.8 g/dL (ref 12.0–15.0)
MCH: 30.9 pg (ref 26.0–34.0)
MCHC: 34.7 g/dL (ref 30.0–36.0)
MCV: 89 fL (ref 78.0–100.0)
PLATELETS: 300 10*3/uL (ref 150–400)
RBC: 4.47 MIL/uL (ref 3.87–5.11)
RDW: 12.1 % (ref 11.5–15.5)
WBC: 6.7 10*3/uL (ref 4.0–10.5)

## 2014-09-08 MED ORDER — NAPROXEN 500 MG PO TABS: 500.0000 mg | ORAL_TABLET | Freq: Two times a day (BID) | ORAL | Status: DC

## 2014-09-08 MED ORDER — KETOROLAC TROMETHAMINE 60 MG/2ML IM SOLN
60.0000 mg | Freq: Once | INTRAMUSCULAR | Status: AC
  Administered 2014-09-08: 60 mg via INTRAMUSCULAR
  Filled 2014-09-08: qty 2

## 2014-09-08 NOTE — ED Provider Notes (Signed)
CSN: [id number]     Arrival date & time 09/08/14  1234 History  This chart was scribed for non-physician practitioner, Joycie Peek, PA-C, working with Richardean Canal, MD, by Ronney Lion, ED Scribe. This patient was seen in room WTR6/WTR6 and the patient's care was started at 1:58 PM.    Chief Complaint  Patient presents with  . Fall   The history is provided by the patient. No language interpreter was used.     HPI Comments: Emily Woodard is a 34 y.o. female with a history of fibromyalgia who presents to the Emergency Department complaining of a fall yesterday that occurred because patient's legs gave out underneath her when she was walking and she fell. She states she was able to stand up immediately afterwards. No head trauma or loss of consciousness. Rates discomfort as a 5/10. Patient is worried about the possibility of the fall indicating new-onset MS and wanted to be evaluated, since patient's mother has a history of MS. She also complains of hip, back, neck, and shoulder pain from "catching [herself]" during the fall. Patient took Suboxone, Flexeril, and gabapentin, which she normally takes for fibromyalgia, that only reduces her pain somewhat. Patient states she recently started Ambien and wonders if it may have caused her legs to give out.  Past Medical History  Diagnosis Date  . Abortion in first trimester 03/2000  . Depression   . Anxiety     hx panic disorder  . Asthma     no prob as adult - no inhaler  . Recurrent upper respiratory infection (URI)     05/14/2011 - tx with otc  . Seizures     last one 2002 - no meds  . Fibromyalgia    Past Surgical History  Procedure Laterality Date  . Dilation and evacuation  12/2001  . Colposcopy    . Wisdom tooth extraction    . Svd   07/2004, 08/2009    x 2  . Laparoscopic tubal ligation  05/23/2011    Procedure: LAPAROSCOPIC TUBAL LIGATION;  Surgeon: Zenaida Niece, MD;  Location: [facility];  Service: Gynecology;  Laterality:  Bilateral;  . Iud removal  05/23/2011    Procedure: INTRAUTERINE DEVICE (IUD) REMOVAL;  Surgeon: Zenaida Niece, MD;  Location: [facility];  Service: Gynecology;;  . Endometrial ablation  05/23/2011    Procedure: ENDOMETRIAL ABLATION;  Surgeon: Zenaida Niece, MD;  Location: [facility];  Service: Gynecology;;   Family History  Problem Relation Age of Onset  . Diabetes Mother   . Hyperlipidemia Mother   . Hypertension Mother    History  Substance Use Topics  . Smoking status: Former Smoker -- 1.00 packs/day for 6 years    Types: Cigarettes    Quit date: 06/26/2006  . Smokeless tobacco: Never Used  . Alcohol Use: No   OB History    No data available     Review of Systems  Musculoskeletal: Positive for myalgias, back pain, arthralgias and neck pain.  All other systems reviewed and are negative.   Allergies  Review of patient's allergies indicates no known allergies.  Home Medications   Prior to Admission medications   Medication Sig Start Date End Date Taking? Authorizing Provider  buprenorphine-naloxone (SUBOXONE) 8-2 MG SUBL SL tablet Place 1 tablet under the tongue 2 (two) times daily.    Historical Provider, MD  cephALEXin (KEFLEX) 250 MG capsule Take 2 capsules (500 mg total) by mouth 2 (two) times daily. 06/10/14  Marissa Sciacca, PA-C  clonazePAM (KLONOPIN) 0.5 MG tablet Take 0.5 mg by mouth 3 (three) times daily as needed for anxiety.     Historical Provider, MD  DULoxetine (CYMBALTA) 60 MG capsule Take 60 mg by mouth 2 (two) times daily.    Historical Provider, MD  ibuprofen (ADVIL,MOTRIN) 200 MG tablet Take 800 mg by mouth every 6 (six) hours as needed for pain.    Historical Provider, MD  naproxen (NAPROSYN) 500 MG tablet Take 1 tablet (500 mg total) by mouth 2 (two) times daily. 09/08/14   Joycie PeekBenjamin Marcy Sookdeo, PA-C   BP 117/58 mmHg  Pulse 86  Temp(Src) 98 F (36.7 C) (Oral)  SpO2 98% Physical Exam  Constitutional: She is oriented to person, place, and time. She  appears well-developed and well-nourished. No distress.  HENT:  Head: Normocephalic and atraumatic.  Eyes: Conjunctivae and EOM are normal.  Neck: Neck supple. No tracheal deviation present. No thyromegaly present.  Cardiovascular: Normal rate, regular rhythm and normal heart sounds.   Pulmonary/Chest: Effort normal. No respiratory distress.  Lungs are clear to auscultation bilaterally.  Abdominal: Soft. There is no tenderness.  Musculoskeletal: Normal range of motion.  Gait baseline without ataxia.  Lymphadenopathy:    She has no cervical adenopathy.  Neurological: She is alert and oriented to person, place, and time.  Motor and sensation 5/5 in all 4 extremities. Cranial nerves II-XII intact. Mental status appears baseline for patient and situation.  Skin: Skin is warm and dry.  Psychiatric: She has a normal mood and affect. Her behavior is normal.  Nursing note and vitals reviewed.   ED Course  Procedures (including critical care time)  DIAGNOSTIC STUDIES: Oxygen Saturation is 96% on room air, normal by my interpretation.    COORDINATION OF CARE: 2:09 PM - Discussed treatment plan with pt at bedside which includes prescription of Naproxen and a Toradol injection for pain, and pt agreed to plan.   Labs Review Labs Reviewed  BASIC METABOLIC PANEL - Abnormal; Notable for the following:    Glucose, Bld 136 (*)    All other components within normal limits  CBC    Imaging Review No results found.   EKG Interpretation None     Meds given in ED:  Medications  ketorolac (TORADOL) injection 60 mg (60 mg Intramuscular Given 09/08/14 1438)    Discharge Medication List as of 09/08/2014  2:17 PM    START taking these medications   Details  naproxen (NAPROSYN) 500 MG tablet Take 1 tablet (500 mg total) by mouth 2 (two) times daily., Starting 09/08/2014, Until Discontinued, Print       Filed Vitals:   09/08/14 1240 09/08/14 1450  BP: 117/58   Pulse: 94 86  Temp: 98 F  (36.7 C)   TempSrc: Oral   SpO2: 96% 98%    MDM  Vitals stable - WNL -afebrile Pt resting comfortably in ED. PE--normal neuro exam. Gait baseline. Labwork noncontributory  DDX-patient fell yesterday, was immediately ambulatory, did not lose consciousness and did not hit her head. No evidence of acute or emergent cause for her fall. Denies dizziness, syncope, palpitations, chest pain or shortness of breath. Patient requests anti-inflammatories to assist with her acute exacerbation of her chronic fibromyalgia pain.  I discussed all relevant lab findings and imaging results with pt and they verbalized understanding. Discussed f/u with PCP within 48 hrs and return precautions, pt very amenable to plan. Patient stable, in good condition and ambulatory out of the ED without difficulty.  Final diagnoses:  Fall, initial encounter   I personally performed the services described in this documentation, which was scribed in my presence. The recorded information has been reviewed and is accurate.     Joycie Peek, PA-C 09/08/14 2014  Richardean Canal, MD 09/09/14 0700

## 2014-09-08 NOTE — ED Notes (Signed)
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 Complains of pain all over. No trouble walking today, feels more sore today.

## 2014-09-08 NOTE — Discharge Instructions (Signed)
You were evaluated in the ED today after your fall. There does not appear to be any emergent source or consequences from your fall. You may take your naproxen as directed for discomfort he may experience. Please follow-up with your primary care for further evaluation and management of your symptoms. Return to ED for new or worsening symptoms.

## 2014-11-13 ENCOUNTER — Encounter (HOSPITAL_COMMUNITY): Payer: Self-pay

## 2014-11-13 ENCOUNTER — Emergency Department (HOSPITAL_COMMUNITY)
Admission: EM | Admit: 2014-11-13 | Discharge: 2014-11-13 | Disposition: A | Discharge status: Home / Self Care | Payer: 59 | Attending: Emergency Medicine | Admitting: Emergency Medicine

## 2014-11-13 DIAGNOSIS — J45909 Unspecified asthma, uncomplicated: Secondary | ICD-10-CM | POA: Diagnosis not present

## 2014-11-13 DIAGNOSIS — M7989 Other specified soft tissue disorders: Secondary | ICD-10-CM | POA: Diagnosis present

## 2014-11-13 DIAGNOSIS — Z792 Long term (current) use of antibiotics: Secondary | ICD-10-CM | POA: Insufficient documentation

## 2014-11-13 DIAGNOSIS — Z791 Long term (current) use of non-steroidal anti-inflammatories (NSAID): Secondary | ICD-10-CM | POA: Insufficient documentation

## 2014-11-13 DIAGNOSIS — Z79899 Other long term (current) drug therapy: Secondary | ICD-10-CM | POA: Insufficient documentation

## 2014-11-13 DIAGNOSIS — Z87891 Personal history of nicotine dependence: Secondary | ICD-10-CM | POA: Diagnosis not present

## 2014-11-13 DIAGNOSIS — Z8659 Personal history of other mental and behavioral disorders: Secondary | ICD-10-CM | POA: Diagnosis not present

## 2014-11-13 DIAGNOSIS — R609 Edema, unspecified: Secondary | ICD-10-CM | POA: Diagnosis not present

## 2014-11-13 DIAGNOSIS — R600 Localized edema: Secondary | ICD-10-CM

## 2014-11-13 DIAGNOSIS — M797 Fibromyalgia: Secondary | ICD-10-CM | POA: Diagnosis not present

## 2014-11-13 DIAGNOSIS — G40909 Epilepsy, unspecified, not intractable, without status epilepticus: Secondary | ICD-10-CM | POA: Diagnosis not present

## 2014-11-13 DIAGNOSIS — R42 Dizziness and giddiness: Secondary | ICD-10-CM | POA: Diagnosis not present

## 2014-11-13 LAB — CBC WITH DIFFERENTIAL/PLATELET
BASOS ABS: 0.1 10*3/uL (ref 0.0–0.1)
BASOS PCT: 1 % (ref 0–1)
EOS ABS: 0.3 10*3/uL (ref 0.0–0.7)
Eosinophils Relative: 4 % (ref 0–5)
HCT: 40.1 % (ref 36.0–46.0)
Hemoglobin: 13.6 g/dL (ref 12.0–15.0)
Lymphocytes Relative: 38 % (ref 12–46)
Lymphs Abs: 2.5 10*3/uL (ref 0.7–4.0)
MCH: 30.6 pg (ref 26.0–34.0)
MCHC: 33.9 g/dL (ref 30.0–36.0)
MCV: 90.1 fL (ref 78.0–100.0)
MONOS PCT: 7 % (ref 3–12)
Monocytes Absolute: 0.5 10*3/uL (ref 0.1–1.0)
NEUTROS ABS: 3.2 10*3/uL (ref 1.7–7.7)
NEUTROS PCT: 50 % (ref 43–77)
PLATELETS: 307 10*3/uL (ref 150–400)
RBC: 4.45 MIL/uL (ref 3.87–5.11)
RDW: 12.5 % (ref 11.5–15.5)
WBC: 6.4 10*3/uL (ref 4.0–10.5)

## 2014-11-13 LAB — BASIC METABOLIC PANEL
Anion gap: 10 (ref 5–15)
BUN: 14 mg/dL (ref 6–20)
CHLORIDE: 103 mmol/L (ref 101–111)
CO2: 26 mmol/L (ref 22–32)
Calcium: 8.8 mg/dL — ABNORMAL LOW (ref 8.9–10.3)
Creatinine, Ser: 0.65 mg/dL (ref 0.44–1.00)
GFR calc Af Amer: >60 mL/min (ref >60)
GFR calc non Af Amer: >60 mL/min (ref >60)
GLUCOSE: 95 mg/dL (ref 65–99)
POTASSIUM: 4.1 mmol/L (ref 3.5–5.1)
SODIUM: 139 mmol/L (ref 135–145)

## 2014-11-13 MED ORDER — POTASSIUM CHLORIDE ER 10 MEQ PO TBCR: 10.0000 meq | EXTENDED_RELEASE_TABLET | Freq: Every day | ORAL | Status: DC

## 2014-11-13 MED ORDER — FUROSEMIDE 20 MG PO TABS: 20.0000 mg | ORAL_TABLET | Freq: Every day | ORAL | Status: DC

## 2014-11-13 NOTE — Discharge Instructions (Signed)
Fibromyalgia Fibromyalgia is a disorder that is often misunderstood. It is associated with muscular pains and tenderness that comes and goes. It is often associated with fatigue and sleep disturbances. Though it tends to be long-lasting, fibromyalgia is not life-threatening. CAUSES  The exact cause of fibromyalgia is unknown. People with certain gene types are predisposed to developing fibromyalgia and other conditions. Certain factors can play a role as triggers, such as:  Spine disorders.  Arthritis.  Severe injury (trauma) and other physical stressors.  Emotional stressors. SYMPTOMS   The main symptom is pain and stiffness in the muscles and joints, which can vary over time.  Sleep and fatigue problems. Other related symptoms may include:  Bowel and bladder problems.  Headaches.  Visual problems.  Problems with odors and noises.  Depression or mood changes.  Painful periods (dysmenorrhea).  Dryness of the skin or eyes. DIAGNOSIS  There are no specific tests for diagnosing fibromyalgia. Patients can be diagnosed accurately from the specific symptoms they have. The diagnosis is made by determining that nothing else is causing the problems. TREATMENT  There is no cure. Management includes medicines and an active, healthy lifestyle. The goal is to enhance physical fitness, decrease pain, and improve sleep. HOME CARE INSTRUCTIONS   Only take over-the-counter or prescription medicines as directed by your caregiver. Sleeping pills, tranquilizers, and pain medicines may make your problems worse.  Low-impact aerobic exercise is very important and advised for treatment. At first, it may seem to make pain worse. Gradually increasing your tolerance will overcome this feeling.  Learning relaxation techniques and how to control stress will help you. Biofeedback, visual imagery, hypnosis, muscle relaxation, yoga, and meditation are all options.  Anti-inflammatory medicines and  physical therapy may provide short-term help.  Acupuncture or massage treatments may help.  Take muscle relaxant medicines as suggested by your caregiver.  Avoid stressful situations.  Plan a healthy lifestyle. This includes your diet, sleep, rest, exercise, and friends.  Find and practice a hobby you enjoy.  Join a fibromyalgia support group for interaction, ideas, and sharing advice. This may be helpful. SEEK MEDICAL CARE IF:  You are not having good results or improvement from your treatment. FOR MORE INFORMATION  National Fibromyalgia Association: www.fmaware.org Arthritis Foundation: www.arthritis.org Document Released: 06/12/2005 Document Revised: 09/04/2011 Document Reviewed: 09/22/2009 [facility]ExitCare Patient Information 2015 [place], [place]. This information is not intended to replace advice given to you by your health care provider. Make sure you discuss any questions you have with your health care provider. Edema Edema is an abnormal buildup of fluids in your bodytissues. Edema is somewhatdependent on gravity to pull the fluid to the lowest place in your body. That makes the condition more common in the legs and thighs (lower extremities). Painless swelling of the feet and ankles is common and becomes more likely as you get older. It is also common in looser tissues, like around your eyes.  When the affected area is squeezed, the fluid may move out of that spot and leave a dent for a few moments. This dent is called pitting.  CAUSES  There are many possible causes of edema. Eating too much salt and being on your feet or sitting for a long time can cause edema in your legs and ankles. Hot weather may make edema worse. Common medical causes of edema include:  Heart failure.  Liver disease.  Kidney disease.  Weak blood vessels in your legs.  Cancer.  An injury.  Pregnancy.  Some medications.  Obesity. SYMPTOMS  Edema is usually painless.Your skin may look swollen or  shiny.  DIAGNOSIS  Your health care provider may be able to diagnose edema by asking about your medical history and doing a physical exam. You may need to have tests such as X-rays, an electrocardiogram, or blood tests to check for medical conditions that may cause edema.  TREATMENT  Edema treatment depends on the cause. If you have heart, liver, or kidney disease, you need the treatment appropriate for these conditions. General treatment may include:  Elevation of the affected body part above the level of your heart.  Compression of the affected body part. Pressure from elastic bandages or support stockings squeezes the tissues and forces fluid back into the blood vessels. This keeps fluid from entering the tissues.  Restriction of fluid and salt intake.  Use of a water pill (diuretic). These medications are appropriate only for some types of edema. They pull fluid out of your body and make you urinate more often. This gets rid of fluid and reduces swelling, but diuretics can have side effects. Only use diuretics as directed by your health care provider. HOME CARE INSTRUCTIONS   Keep the affected body part above the level of your heart when you are lying down.   Do not sit still or stand for prolonged periods.   Do not put anything directly under your knees when lying down.  Do not wear constricting clothing or garters on your upper legs.   Exercise your legs to work the fluid back into your blood vessels. This may help the swelling go down.   Wear elastic bandages or support stockings to reduce ankle swelling as directed by your health care provider.   Eat a low-salt diet to reduce fluid if your health care provider recommends it.   Only take medicines as directed by your health care provider. SEEK MEDICAL CARE IF:   Your edema is not responding to treatment.  You have heart, liver, or kidney disease and notice symptoms of edema.  You have edema in your legs that does not  improve after elevating them.   You have sudden and unexplained weight gain. SEEK IMMEDIATE MEDICAL CARE IF:   You develop shortness of breath or chest pain.   You cannot breathe when you lie down.  You develop pain, redness, or warmth in the swollen areas.   You have heart, liver, or kidney disease and suddenly get edema.  You have a fever and your symptoms suddenly get worse. MAKE SURE YOU:   Understand these instructions.  Will watch your condition.  Will get help right away if you are not doing well or get worse. Document Released: 06/12/2005 Document Revised: 10/27/2013 Document Reviewed: 04/04/2013 [facility]ExitCare Patient Information 2015 [place]ExitCare, [place]LLC. This information is not intended to replace advice given to you by your health care provider. Make sure you discuss any questions you have with your health care provider.

## 2014-11-13 NOTE — ED Notes (Signed)
Pt with dizziness, vertigo, leg pain x 2 months.  On/Off getting worse.  Pt states her legs hurt and leg swelling.  Pt spoke to her pain doctor.  She has fibromyalgia.  Pt sees them on Tuesday.  No n/v.

## 2014-11-13 NOTE — ED Provider Notes (Signed)
CSN: [id number]     Arrival date & time 11/13/14  1257 History   First MD Initiated Contact with Patient 11/13/14 1552     Chief Complaint  Patient presents with  . Dizziness  . Leg Swelling     (Consider location/radiation/quality/duration/timing/severity/associated sxs/prior Treatment) HPI Comments: Patient here complaining of chronic leg swelling with dizziness and vertigo 2 months. Relates this to her history of fibromyalgia. States her symptoms have been unchanged for the past 2 months but is here at the urging of her husband. She is in pain management at this time has been compliant with her medications. Denies any anginal type chest pain. Denies any shortness of breath. No CHF symptoms. No new abdominal pain. No urinary symptoms. Swelling is bilateral persistent. Overall pain symptoms seem to be worse with movement better with rest. No treatment for these complaints used prior to arrival 1+ bilateral lower extremity pitting edema 1+one 1 ss  Patient is a 34 y.o. female presenting with dizziness. The history is provided by the patient.  Dizziness   Past Medical History  Diagnosis Date  . Abortion in first trimester 03/2000  . Depression   . Anxiety     hx panic disorder  . Asthma     no prob as adult - no inhaler  . Recurrent upper respiratory infection (URI)     05/14/2011 - tx with otc  . Seizures     last one 2002 - no meds  . Fibromyalgia    Past Surgical History  Procedure Laterality Date  . Dilation and evacuation  12/2001  . Colposcopy    . Wisdom tooth extraction    . Svd   07/2004, 08/2009    x 2  . Laparoscopic tubal ligation  05/23/2011    Procedure: LAPAROSCOPIC TUBAL LIGATION;  Surgeon: Zenaida Nieceodd D Meisinger, MD;  Location: [facility];  Service: Gynecology;  Laterality: Bilateral;  . Iud removal  05/23/2011    Procedure: INTRAUTERINE DEVICE (IUD) REMOVAL;  Surgeon: Zenaida Nieceodd D Meisinger, MD;  Location: [facility];  Service: Gynecology;;  . Endometrial ablation  05/23/2011   Procedure: ENDOMETRIAL ABLATION;  Surgeon: Zenaida Nieceodd D Meisinger, MD;  Location: [facility];  Service: Gynecology;;   Family History  Problem Relation Age of Onset  . Diabetes Mother   . Hyperlipidemia Mother   . Hypertension Mother    History  Substance Use Topics  . Smoking status: Former Smoker -- 1.00 packs/day for 6 years    Types: Cigarettes    Quit date: 06/26/2006  . Smokeless tobacco: Never Used  . Alcohol Use: No   OB History    No data available     Review of Systems  Neurological: Positive for dizziness.  All other systems reviewed and are negative.     Allergies  Review of patient's allergies indicates no known allergies.  Home Medications   Prior to Admission medications   Medication Sig Start Date End Date Taking? Authorizing Provider  busPIRone (BUSPAR) 10 MG tablet Take 10 mg by mouth 2 (two) times daily.  11/11/14  Yes Historical Provider, MD  clonazePAM (KLONOPIN) 0.5 MG tablet Take 0.5 mg by mouth 2 (two) times daily as needed for anxiety.    Yes Historical Provider, MD  cyclobenzaprine (FLEXERIL) 10 MG tablet Take 10 mg by mouth every 12 (twelve) hours.  10/20/14  Yes Historical Provider, MD  gabapentin (NEURONTIN) 300 MG capsule Take 300 mg by mouth 3 (three) times daily. 11/11/14  Yes Historical Provider, MD  naproxen sodium (  ANAPROX) 220 MG tablet Take 440 mg by mouth every 12 (twelve) hours as needed (pain).   Yes Historical Provider, MD  venlafaxine XR (EFFEXOR-XR) 150 MG 24 hr capsule Take 150 mg by mouth daily. 10/12/14  Yes Historical Provider, MD  venlafaxine XR (EFFEXOR-XR) 75 MG 24 hr capsule Take 75 mg by mouth daily. 11/11/14  Yes Historical Provider, MD  zolpidem (AMBIEN) 10 MG tablet Take 10 mg by mouth at bedtime. 11/11/14  Yes Historical Provider, MD  ZUBSOLV 1.4-0.36 MG SUBL Place 1 tablet under the tongue 3 (three) times daily. 10/20/14  Yes Historical Provider, MD  cephALEXin (KEFLEX) 250 MG capsule Take 2 capsules (500 mg total) by mouth 2 (two)  times daily. Patient not taking: Reported on 11/13/2014 06/10/14   Marissa Sciacca, PA-C  naproxen (NAPROSYN) 500 MG tablet Take 1 tablet (500 mg total) by mouth 2 (two) times daily. Patient not taking: Reported on 11/13/2014 09/08/14   Joycie PeekBenjamin Cartner, PA-C   BP 120/76 mmHg  Pulse 80  Temp(Src) 98.3 F (36.8 C) (Oral)  Resp 18  SpO2 99%  LMP 10/14/2014 (Approximate) Physical Exam  Constitutional: She is oriented to person, place, and time. She appears well-developed and well-nourished.  Non-toxic appearance. No distress.  HENT:  Head: Normocephalic and atraumatic.  Eyes: Conjunctivae, EOM and lids are normal. Pupils are equal, round, and reactive to light.  Neck: Normal range of motion. Neck supple. No tracheal deviation present. No thyroid mass present.  Cardiovascular: Normal rate, regular rhythm and normal heart sounds.  Exam reveals no gallop.   No murmur heard. Pulmonary/Chest: Effort normal and breath sounds normal. No stridor. No respiratory distress. She has no decreased breath sounds. She has no wheezes. She has no rhonchi. She has no rales.  Abdominal: Soft. Normal appearance and bowel sounds are normal. She exhibits no distension. There is no tenderness. There is no rebound and no CVA tenderness.  Musculoskeletal: Normal range of motion. She exhibits no edema or tenderness.  1 plus bilateral lower extremity pitting edema  Neurological: She is alert and oriented to person, place, and time. She has normal strength. No cranial nerve deficit or sensory deficit. GCS eye subscore is 4. GCS verbal subscore is 5. GCS motor subscore is 6.  Skin: Skin is warm and dry. No abrasion and no rash noted.  Psychiatric: She has a normal mood and affect. Her speech is normal and behavior is normal.  Nursing note and vitals reviewed.   ED Course  Procedures (including critical care time) Labs Review Labs Reviewed  BASIC METABOLIC PANEL - Abnormal; Notable for the following:    Calcium 8.8  (*)    All other components within normal limits  CBC WITH DIFFERENTIAL/PLATELET    Imaging Review No results found.   EKG Interpretation None      MDM   Final diagnoses:  None   Will place patient on short course of diuretics. No acute emergent conditions noted. She has a follow-up appointment with her pain management specialist next week and she was encouraged to keep this    Lorre NickAnthony Shannette Tabares, MD 11/13/14 1609

## 2015-06-30 ENCOUNTER — Ambulatory Visit: Payer: Self-pay | Admitting: Internal Medicine

## 2015-07-10 ENCOUNTER — Emergency Department (INDEPENDENT_AMBULATORY_CARE_PROVIDER_SITE_OTHER)
Admission: EM | Admit: 2015-07-10 | Discharge: 2015-07-10 | Disposition: A | Discharge status: Home / Self Care | Payer: 59 | Source: Home / Self Care | Attending: Family Medicine | Admitting: Family Medicine

## 2015-07-10 ENCOUNTER — Encounter (HOSPITAL_COMMUNITY): Payer: Self-pay | Admitting: Emergency Medicine

## 2015-07-10 DIAGNOSIS — R102 Pelvic and perineal pain: Secondary | ICD-10-CM | POA: Diagnosis not present

## 2015-07-10 DIAGNOSIS — G894 Chronic pain syndrome: Secondary | ICD-10-CM | POA: Diagnosis not present

## 2015-07-10 DIAGNOSIS — N809 Endometriosis, unspecified: Secondary | ICD-10-CM

## 2015-07-10 MED ORDER — OXYCODONE-ACETAMINOPHEN 5-325 MG PO TABS: 1.0000 | ORAL_TABLET | ORAL | Status: DC | PRN

## 2015-07-10 NOTE — ED Notes (Signed)
C/o chronic pelvic pain... Hx of Endometriosis .... Provider was not able to fill her Oxycodone on Friday since he was not in the office Steady gait... A&O x4... No acute distress.

## 2015-07-10 NOTE — ED Provider Notes (Signed)
CSN: [id number]     Arrival date & time 07/10/15  1303 History   First MD Initiated Contact with Patient 07/10/15 1339     Chief Complaint  Patient presents with  . Abdominal Pain  . Medication Refill   (Consider location/radiation/quality/duration/timing/severity/associated sxs/prior Treatment) HPI Comments: 35 year old female with a long history of pelvic pain with endometriosis is currently being treated by the OB/GYN. Apparently some of the medication for treatment is too expensive and insurance will not pay for it and she is now in preparation for having a hysterectomy. She has been given small amounts of Percocet for pain by her OB/GYN. When she went by to get a refill on Friday they had closed early. She is having increased pelvic pain. Same location, same character with increased intensity. No vaginal bleeding or discharge. No upper abdominal pain.   Past Medical History  Diagnosis Date  . Abortion in first trimester 03/2000  . Depression   . Anxiety     hx panic disorder  . Asthma     no prob as adult - no inhaler  . Recurrent upper respiratory infection (URI)     05/14/2011 - tx with otc  . Seizures ([facility])     last one 2002 - no meds  . Fibromyalgia    Past Surgical History  Procedure Laterality Date  . Dilation and evacuation  12/2001  . Colposcopy    . Wisdom tooth extraction    . Svd   07/2004, 08/2009    x 2  . Laparoscopic tubal ligation  05/23/2011    Procedure: LAPAROSCOPIC TUBAL LIGATION;  Surgeon: Zenaida Nieceodd D Meisinger, MD;  Location: [facility];  Service: Gynecology;  Laterality: Bilateral;  . Iud removal  05/23/2011    Procedure: INTRAUTERINE DEVICE (IUD) REMOVAL;  Surgeon: Zenaida Nieceodd D Meisinger, MD;  Location: [facility];  Service: Gynecology;;  . Endometrial ablation  05/23/2011    Procedure: ENDOMETRIAL ABLATION;  Surgeon: Zenaida Nieceodd D Meisinger, MD;  Location: [facility];  Service: Gynecology;;   Family History  Problem Relation Age of Onset  . Diabetes Mother   . Hyperlipidemia  Mother   . Hypertension Mother    Social History  Substance Use Topics  . Smoking status: Former Smoker -- 1.00 packs/day for 6 years    Types: Cigarettes    Quit date: 06/26/2006  . Smokeless tobacco: Never Used  . Alcohol Use: No   OB History    No data available     Review of Systems  Constitutional: Positive for activity change. Negative for fever and fatigue.  HENT: Negative.   Respiratory: Negative.   Cardiovascular: Negative.   Genitourinary: Positive for pelvic pain. Negative for dysuria.  Musculoskeletal: Negative.   Skin: Negative.   Neurological: Negative.     Allergies  Review of patient's allergies indicates no known allergies.  Home Medications   Prior to Admission medications   Medication Sig Start Date End Date Taking? Authorizing Provider  busPIRone (BUSPAR) 10 MG tablet Take 10 mg by mouth 2 (two) times daily.  11/11/14  Yes Historical Provider, MD  gabapentin (NEURONTIN) 300 MG capsule Take 300 mg by mouth 3 (three) times daily. 11/11/14  Yes Historical Provider, MD  potassium chloride (K-DUR) 10 MEQ tablet Take 1 tablet (10 mEq total) by mouth daily. 11/13/14  Yes Lorre NickAnthony Allen, MD  venlafaxine XR (EFFEXOR-XR) 150 MG 24 hr capsule Take 150 mg by mouth daily. 10/12/14  Yes Historical Provider, MD  zolpidem (AMBIEN) 10 MG tablet Take 10 mg  by mouth at bedtime. 11/11/14  Yes Historical Provider, MD  cephALEXin (KEFLEX) 250 MG capsule Take 2 capsules (500 mg total) by mouth 2 (two) times daily. Patient not taking: Reported on 11/13/2014 06/10/14   Marissa Sciacca, PA-C  clonazePAM (KLONOPIN) 0.5 MG tablet Take 0.5 mg by mouth 2 (two) times daily as needed for anxiety.     Historical Provider, MD  cyclobenzaprine (FLEXERIL) 10 MG tablet Take 10 mg by mouth every 12 (twelve) hours.  10/20/14   Historical Provider, MD  furosemide (LASIX) 20 MG tablet Take 1 tablet (20 mg total) by mouth daily. 11/13/14   Lorre Nick, MD  naproxen (NAPROSYN) 500 MG tablet Take 1  tablet (500 mg total) by mouth 2 (two) times daily. Patient not taking: Reported on 11/13/2014 09/08/14   Joycie Peek, PA-C  naproxen sodium (ANAPROX) 220 MG tablet Take 440 mg by mouth every 12 (twelve) hours as needed (pain).    Historical Provider, MD  oxyCODONE-acetaminophen (PERCOCET/ROXICET) 5-325 MG tablet Take 1 tablet by mouth every 4 (four) hours as needed for severe pain. 07/10/15   Hayden Rasmussen, NP  venlafaxine XR (EFFEXOR-XR) 75 MG 24 hr capsule Take 75 mg by mouth daily. 11/11/14   Historical Provider, MD  ZUBSOLV 1.4-0.36 MG SUBL Place 1 tablet under the tongue 3 (three) times daily. 10/20/14   Historical Provider, MD   Meds Ordered and Administered this Visit  Medications - No data to display  BP 141/92 mmHg  Pulse 106  Temp(Src) 97.8 F (36.6 C) (Oral)  Resp 18  SpO2 99%  LMP 05/10/2015 No data found.   Physical Exam  Constitutional: She is oriented to person, place, and time. She appears well-developed and well-nourished. No distress.  Eyes: EOM are normal.  Neck: Normal range of motion. Neck supple.  Cardiovascular: Normal rate.   Pulmonary/Chest: Effort normal. No respiratory distress.  Abdominal:  Tenderness across the anterior pelvis however patient will not let me complete the exam due to increased pain with palpation.  Musculoskeletal: She exhibits no edema.  Neurological: She is alert and oriented to person, place, and time. She exhibits normal muscle tone.  Skin: Skin is warm and dry.  Psychiatric: She has a normal mood and affect.  Nursing note and vitals reviewed.   ED Course  Procedures (including critical care time)  Labs Review Labs Reviewed - No data to display  Imaging Review No results found.   Visual Acuity Review  Right Eye Distance:   Left Eye Distance:   Bilateral Distance:    Right Eye Near:   Left Eye Near:    Bilateral Near:         MDM   1. Pelvic pain in female   2. Chronic pain disorder   3. Endometriosis   [place]  CS report Syst indicates 150 tabs of hydro or oxycodone tabs and Tramadol #90 in past month. Must f/u with PCP We will not be able to refill any more meds. Percocet 5mg  #8 tabs.  Hayden Rasmussen, NP 07/10/15 1409

## 2015-07-10 NOTE — Discharge Instructions (Signed)
Pain Medicine Instructions °HOW CAN PAIN MEDICINE AFFECT ME? °You were given a prescription for pain medicine. This medicine may make you tired or drowsy and may affect your ability to think clearly. Pain medicine may also affect your ability to drive or perform certain physical activities. It may not be possible to make all of your pain go away, but you should be comfortable enough to move, breathe, and take care of yourself. °HOW OFTEN SHOULD I TAKE PAIN MEDICINE AND HOW MUCH SHOULD I TAKE? °Take pain medicine only as directed by your health care provider and only as needed for pain. °You do not need to take pain medicine if you are not having pain, unless directed by your health care provider. °You can take less than the prescribed dose if you find that a smaller amount of medicine controls your pain. °WHAT RESTRICTIONS DO I HAVE WHILE TAKING PAIN MEDICINE? °Follow these instructions after you start taking pain medicine, while you are taking the medicine, and for 8 hours after you stop taking the medicine: °Do not drive. °Do not operate machinery. °Do not operate power tools. °Do not sign legal documents. °Do not drink alcohol. °Do not take sleeping pills. °Do not supervise children by yourself. °Do not participate in activities that require climbing or being in high places. °Do not enter a body of water--such as a lake, river, ocean, spa, or swimming pool--without an adult nearby who can monitor and help you. °HOW CAN I KEEP OTHERS SAFE WHILE I AM TAKING PAIN MEDICINE? °Store your pain medicine as directed by your health care provider. Make sure that it is placed where children and pets cannot reach it. °Never share your pain medicine with anyone. °Do not save any leftover pills. If you have any leftover pain medicine, get rid of it or destroy it as directed by your health care provider. °WHAT ELSE DO I NEED TO KNOW ABOUT TAKING PAIN MEDICINE? °Use a stool softener if you become constipated from your pain  medicine. Increasing your intake of fruits and vegetables will also help with constipation. °Write down the times when you take your pain medicine. Look at the times before you take your next dose of medicine. It is easy to become confused while on pain medicine. Recording the times helps you to avoid an overdose. °If your pain is severe, do not try to treat it yourself by taking more pills than instructed on your prescription. Contact your health care provider for help. °You may have been prescribed a pain medicine that contains acetaminophen. Do not take any other acetaminophen while taking this medicine. An overdose of acetaminophen can result in severe liver damage. Acetaminophen is found in many over-the-counter (OTC) and prescription medicines. If you are taking any medicines in addition to your pain medicine, check the active ingredients on those medicines to see if acetaminophen is listed. °WHEN SHOULD I CALL MY HEALTH CARE PROVIDER? °Your medicine is not helping to make the pain go away. °You vomit or have diarrhea shortly after taking the medicine. °You develop new pain in areas that did not hurt before. °You have an allergic reaction to your medicine. This may include: °Itchiness. °Swelling. °Dizziness. °Developing a new rash. °WHEN SHOULD I CALL 911 OR GO TO THE EMERGENCY ROOM? °You feel dizzy or you faint. °You are very confused or disoriented. °You repeatedly vomit. °Your skin or lips turn pale or bluish in color. °You have shortness of breath or you are breathing much more slowly than usual. °You have   You repeatedly vomit.  Your skin or lips turn pale or bluish in color.  You have shortness of breath or you are breathing much more slowly than usual.  You have a severe allergic reaction to your medicine. This includes:  Developing tongue swelling.  Having difficulty breathing.   This information is not intended to replace advice given to you by your health care provider. Make sure you discuss any questions you have with your health care provider.   Document Released: 09/18/2000 Document  Revised: 10/27/2014 Document Reviewed: 04/16/2014 Elsevier Interactive Patient Education 2016 Else[employer] Inc[employer]  Pelvic Pain, Female Pelvic pain is pain felt below the belly button and between your hips. It can be caused by many different things. It is important to get help right away. This is especially true for severe, sharp, or unusual pain that comes on suddenly.  HOME CARE  Only take medicine as told by your doctor.  Rest as told by your doctor.  Eat a healthy diet, such as fruits, vegetables, and lean meats.  Drink enough fluids to keep your pee (urine) clear or pale yellow, or as told.  Avoid sex (intercourse) if it causes pain.  Apply warm or cold packs to your lower belly (abdomen). Use the type of pack that helps the pain.  Avoid situations that cause you stress.  Keep a journal to track your pain. Write down:  When the pain started.  Where it is located.  If there are things that seem to be related to the pain, such as food or your period.  Follow up with your doctor as told. GET HELP RIGHT AWAY IF:   You have heavy bleeding from the vagina.  You have more pelvic pain.  You feel lightheaded or pass out (faint).  You have chills.  You have pain when you pee or have blood in your pee.  You cannot stop having watery poop (diarrhea).  You cannot stop throwing up (vomiting).  You have a fever or lasting symptoms for more than 3 days.  You have a fever and your symptoms suddenly get worse.  You are being physically or sexually abused.  Your medicine does not help your pain.  You have fluid (discharge) coming from your vagina that is not normal. MAKE SURE YOU:  Understand these instructions.  Will watch your condition.  Will get help if you are not doing well or get worse.   This information is not intended to replace advice given to you by your health care provider. Make sure you discuss any questions you have with your health care provider.     Document Released: 11/29/2007 Document Revised: 07/03/2014 Document Reviewed: 10/02/2011 Elsevier Interactive Patient Education [employer] Inc[employer]

## 2015-08-03 ENCOUNTER — Encounter (HOSPITAL_COMMUNITY): Payer: Self-pay | Admitting: Family Medicine

## 2015-08-03 ENCOUNTER — Emergency Department (HOSPITAL_COMMUNITY)
Admission: EM | Admit: 2015-08-03 | Discharge: 2015-08-03 | Disposition: A | Discharge status: Home / Self Care | Payer: 59 | Attending: Emergency Medicine | Admitting: Emergency Medicine

## 2015-08-03 ENCOUNTER — Emergency Department (HOSPITAL_COMMUNITY): Payer: 59

## 2015-08-03 DIAGNOSIS — R42 Dizziness and giddiness: Secondary | ICD-10-CM | POA: Insufficient documentation

## 2015-08-03 DIAGNOSIS — Z87891 Personal history of nicotine dependence: Secondary | ICD-10-CM | POA: Insufficient documentation

## 2015-08-03 DIAGNOSIS — M797 Fibromyalgia: Secondary | ICD-10-CM | POA: Diagnosis not present

## 2015-08-03 DIAGNOSIS — F410 Panic disorder [episodic paroxysmal anxiety] without agoraphobia: Secondary | ICD-10-CM | POA: Diagnosis not present

## 2015-08-03 DIAGNOSIS — R079 Chest pain, unspecified: Secondary | ICD-10-CM | POA: Diagnosis not present

## 2015-08-03 DIAGNOSIS — J45901 Unspecified asthma with (acute) exacerbation: Secondary | ICD-10-CM | POA: Insufficient documentation

## 2015-08-03 DIAGNOSIS — F329 Major depressive disorder, single episode, unspecified: Secondary | ICD-10-CM | POA: Diagnosis not present

## 2015-08-03 DIAGNOSIS — Z79899 Other long term (current) drug therapy: Secondary | ICD-10-CM | POA: Insufficient documentation

## 2015-08-03 LAB — BASIC METABOLIC PANEL
Anion gap: 10 (ref 5–15)
BUN: 14 mg/dL (ref 6–20)
CHLORIDE: 108 mmol/L (ref 101–111)
CO2: 24 mmol/L (ref 22–32)
CREATININE: 0.57 mg/dL (ref 0.44–1.00)
Calcium: 8.9 mg/dL (ref 8.9–10.3)
GFR calc non Af Amer: >60 mL/min (ref >60)
Glucose, Bld: 126 mg/dL — ABNORMAL HIGH (ref 65–99)
Potassium: 3.8 mmol/L (ref 3.5–5.1)
SODIUM: 142 mmol/L (ref 135–145)

## 2015-08-03 LAB — CBC
HCT: 37.6 % (ref 36.0–46.0)
Hemoglobin: 12.6 g/dL (ref 12.0–15.0)
MCH: 30.5 pg (ref 26.0–34.0)
MCHC: 33.5 g/dL (ref 30.0–36.0)
MCV: 91 fL (ref 78.0–100.0)
PLATELETS: 348 10*3/uL (ref 150–400)
RBC: 4.13 MIL/uL (ref 3.87–5.11)
RDW: 12 % (ref 11.5–15.5)
WBC: 8.1 10*3/uL (ref 4.0–10.5)

## 2015-08-03 LAB — I-STAT TROPONIN, ED
TROPONIN I, POC: 0 ng/mL (ref 0.00–0.08)
Troponin i, poc: 0 ng/mL (ref 0.00–0.08)

## 2015-08-03 MED ORDER — LORAZEPAM 1 MG PO TABS
1.0000 mg | ORAL_TABLET | Freq: Once | ORAL | Status: AC
  Administered 2015-08-03: 1 mg via ORAL
  Filled 2015-08-03: qty 1

## 2015-08-03 NOTE — ED Notes (Signed)
PA at bedside.

## 2015-08-03 NOTE — Discharge Instructions (Signed)
Nonspecific Chest Pain  °Chest pain can be caused by many different conditions. There is always a chance that your pain could be related to something serious, such as a heart attack or a blood clot in your lungs. Chest pain can also be caused by conditions that are not life-threatening. If you have chest pain, it is very important to follow up with your health care provider. °CAUSES  °Chest pain can be caused by: °· Heartburn. °· Pneumonia or bronchitis. °· Anxiety or stress. °· Inflammation around your heart (pericarditis) or lung (pleuritis or pleurisy). °· A blood clot in your lung. °· A collapsed lung (pneumothorax). It can develop suddenly on its own (spontaneous pneumothorax) or from trauma to the chest. °· Shingles infection (varicella-zoster virus). °· Heart attack. °· Damage to the bones, muscles, and cartilage that make up your chest wall. This can include: °¨ Bruised bones due to injury. °¨ Strained muscles or cartilage due to frequent or repeated coughing or overwork. °¨ Fracture to one or more ribs. °¨ Sore cartilage due to inflammation (costochondritis). °RISK FACTORS  °Risk factors for chest pain may include: °· Activities that increase your risk for trauma or injury to your chest. °· Respiratory infections or conditions that cause frequent coughing. °· Medical conditions or overeating that can cause heartburn. °· Heart disease or family history of heart disease. °· Conditions or health behaviors that increase your risk of developing a blood clot. °· Having had chicken pox (varicella zoster). °SIGNS AND SYMPTOMS °Chest pain can feel like: °· Burning or tingling on the surface of your chest or deep in your chest. °· Crushing, pressure, aching, or squeezing pain. °· Dull or sharp pain that is worse when you move, cough, or take a deep breath. °· Pain that is also felt in your back, neck, shoulder, or arm, or pain that spreads to any of these areas. °Your chest pain may come and go, or it may stay  constant. °DIAGNOSIS °Lab tests or other studies may be needed to find the cause of your pain. Your health care provider may have you take a test called an ambulatory ECG (electrocardiogram). An ECG records your heartbeat patterns at the time the test is performed. You may also have other tests, such as: °· Transthoracic echocardiogram (TTE). During echocardiography, sound waves are used to create a picture of all of the heart structures and to look at how blood flows through your heart. °· Transesophageal echocardiogram (TEE). This is a more advanced imaging test that obtains images from inside your body. It allows your health care provider to see your heart in finer detail. °· Cardiac monitoring. This allows your health care provider to monitor your heart rate and rhythm in real time. °· Holter monitor. This is a portable device that records your heartbeat and can help to diagnose abnormal heartbeats. It allows your health care provider to track your heart activity for several days, if needed. °· Stress tests. These can be done through exercise or by taking medicine that makes your heart beat more quickly. °· Blood tests. °· Imaging tests. °TREATMENT  °Your treatment depends on what is causing your chest pain. Treatment may include: °· Medicines. These may include: °¨ Acid blockers for heartburn. °¨ Anti-inflammatory medicine. °¨ Pain medicine for inflammatory conditions. °¨ Antibiotic medicine, if an infection is present. °¨ Medicines to dissolve blood clots. °¨ Medicines to treat coronary artery disease. °· Supportive care for conditions that do not require medicines. This may include: °¨ Resting. °¨ Applying heat   or cold packs to injured areas. °¨ Limiting activities until pain decreases. °HOME CARE INSTRUCTIONS °· If you were prescribed an antibiotic medicine, finish it all even if you start to feel better. °· Avoid any activities that bring on chest pain. °· Do not use any tobacco products, including  cigarettes, chewing tobacco, or electronic cigarettes. If you need help quitting, ask your health care provider. °· Do not drink alcohol. °· Take medicines only as directed by your health care provider. °· Keep all follow-up visits as directed by your health care provider. This is important. This includes any further testing if your chest pain does not go away. °· If heartburn is the cause for your chest pain, you may be told to keep your head raised (elevated) while sleeping. This reduces the chance that acid will go from your stomach into your esophagus. °· Make lifestyle changes as directed by your health care provider. These may include: °¨ Getting regular exercise. Ask your health care provider to suggest some activities that are safe for you. °¨ Eating a heart-healthy diet. A registered dietitian can help you to learn healthy eating options. °¨ Maintaining a healthy weight. °¨ Managing diabetes, if necessary. °¨ Reducing stress. °SEEK MEDICAL CARE IF: °· Your chest pain does not go away after treatment. °· You have a rash with blisters on your chest. °· You have a fever. °SEEK IMMEDIATE MEDICAL CARE IF:  °· Your chest pain is worse. °· You have an increasing cough, or you cough up blood. °· You have severe abdominal pain. °· You have severe weakness. °· You faint. °· You have chills. °· You have sudden, unexplained chest discomfort. °· You have sudden, unexplained discomfort in your arms, back, neck, or jaw. °· You have shortness of breath at any time. °· You suddenly start to sweat, or your skin gets clammy. °· You feel nauseous or you vomit. °· You suddenly feel light-headed or dizzy. °· Your heart begins to beat quickly, or it feels like it is skipping beats. °These symptoms may represent a serious problem that is an emergency. Do not wait to see if the symptoms will go away. Get medical help right away. Call your local emergency services (911 in the [place] Do not drive yourself to the hospital. °  °This  information is not intended to replace advice given to you by your health care provider. Make sure you discuss any questions you have with your health care provider. °  °Document Released: 03/22/2005 Document Revised: 07/03/2014 Document Reviewed: 01/16/2014 °Elsevier Interactive Patient Education ©2016 Elsevier Inc. ° °

## 2015-08-03 NOTE — ED Provider Notes (Signed)
CSN: [id number]     Arrival date & time 08/03/15  1559 History   First MD Initiated Contact with Patient 08/03/15 1926     Chief Complaint  Patient presents with  . Chest Pain  . Shortness of Breath     (Consider location/radiation/quality/duration/timing/severity/associated sxs/prior Treatment) HPI Comments: Patient presents with chest pain. She has a history of depression anxiety and fibromyalgia. She states that about 11:30 this morning she was riding in a car to work and developed chest pain. She describes it as a sharp heaviness to left side of her chest it radiates to the left side of her neck. It's non-exertional. She does report some shortness of breath but no nausea vomiting or diaphoresis. She felt a little lightheaded with it. She does feel anxious but states she hasn't had chest pain previously with her anxiety attacks. She denies any cough or congestion. She denies any pleuritic pain. It's not worse after eating. She denies any leg pain or swelling. She's a nonsmoker. She states her dad died of a heart complication that she states was congenital at age 73. She denies any other known history of MI in first-degree relatives. She denies any history of hypertension. She states her triglycerides have been elevated in the past. She states she has had a recent echocardiogram but no further heart evaluation.  Patient is a 35 y.o. female presenting with chest pain and shortness of breath.  Chest Pain Associated symptoms: shortness of breath   Associated symptoms: no abdominal pain, no back pain, no cough, no diaphoresis, no dizziness, no fatigue, no fever, no headache, no nausea, no numbness, not vomiting and no weakness   Shortness of Breath Associated symptoms: chest pain   Associated symptoms: no abdominal pain, no cough, no diaphoresis, no fever, no headaches, no rash and no vomiting     Past Medical History  Diagnosis Date  . Abortion in first trimester 03/2000  . Depression   .  Anxiety     hx panic disorder  . Asthma     no prob as adult - no inhaler  . Recurrent upper respiratory infection (URI)     05/14/2011 - tx with otc  . Seizures (HCC)     last one 2002 - no meds  . Fibromyalgia    Past Surgical History  Procedure Laterality Date  . Dilation and evacuation  12/2001  . Colposcopy    . Wisdom tooth extraction    . Svd   07/2004, 08/2009    x 2  . Laparoscopic tubal ligation  05/23/2011    Procedure: LAPAROSCOPIC TUBAL LIGATION;  Surgeon: Zenaida Niece, MD;  Location: [facility];  Service: Gynecology;  Laterality: Bilateral;  . Iud removal  05/23/2011    Procedure: INTRAUTERINE DEVICE (IUD) REMOVAL;  Surgeon: Zenaida Niece, MD;  Location: [facility];  Service: Gynecology;;  . Endometrial ablation  05/23/2011    Procedure: ENDOMETRIAL ABLATION;  Surgeon: Zenaida Niece, MD;  Location: [facility];  Service: Gynecology;;   Family History  Problem Relation Age of Onset  . Diabetes Mother   . Hyperlipidemia Mother   . Hypertension Mother    Social History  Substance Use Topics  . Smoking status: Former Smoker -- 1.00 packs/day for 6 years    Types: Cigarettes    Quit date: 06/26/2006  . Smokeless tobacco: Never Used  . Alcohol Use: No   OB History    No data available     Review of Systems  Constitutional:  Negative for fever, chills, diaphoresis and fatigue.  HENT: Negative for congestion, rhinorrhea and sneezing.   Eyes: Negative.   Respiratory: Positive for shortness of breath. Negative for cough and chest tightness.   Cardiovascular: Positive for chest pain. Negative for leg swelling.  Gastrointestinal: Negative for nausea, vomiting, abdominal pain, diarrhea and blood in stool.  Genitourinary: Negative for frequency, hematuria, flank pain and difficulty urinating.  Musculoskeletal: Negative for back pain and arthralgias.  Skin: Negative for rash.  Neurological: Positive for light-headedness. Negative for dizziness, speech difficulty, weakness,  numbness and headaches.      Allergies  Prednisone  Home Medications   Prior to Admission medications   Medication Sig Start Date End Date Taking? Authorizing Provider  amphetamine-dextroamphetamine (ADDERALL XR) 30 MG 24 hr capsule Take 30 mg by mouth daily.   Yes Historical Provider, MD  busPIRone (BUSPAR) 15 MG tablet Take 15 mg by mouth 2 (two) times daily. 07/01/15  Yes Historical Provider, MD  clonazePAM (KLONOPIN) 0.5 MG tablet Take 0.5 mg by mouth 2 (two) times daily as needed for anxiety.    Yes Historical Provider, MD  cyclobenzaprine (FLEXERIL) 10 MG tablet Take 10 mg by mouth every 12 (twelve) hours.  10/20/14  Yes Historical Provider, MD  gabapentin (NEURONTIN) 300 MG capsule Take 300-900 mg by mouth See admin instructions. Take 600 mg every morning, take 300 mg every day at midday, take 900 mg every night at bedtime. 11/11/14  Yes Historical Provider, MD  naproxen sodium (ANAPROX) 220 MG tablet Take 440 mg by mouth every 12 (twelve) hours as needed (pain).   Yes Historical Provider, MD  oxyCODONE-acetaminophen (PERCOCET/ROXICET) 5-325 MG tablet Take 1 tablet by mouth every 4 (four) hours as needed for severe pain. 07/10/15  Yes Hayden Rasmussen, NP  traMADol (ULTRAM) 50 MG tablet Take 1 tablet by mouth every 6 (six) hours as needed. Pain. 07/26/15  Yes Historical Provider, MD  venlafaxine XR (EFFEXOR-XR) 150 MG 24 hr capsule Take 150 mg by mouth daily with breakfast.  10/12/14  Yes Historical Provider, MD  venlafaxine XR (EFFEXOR-XR) 75 MG 24 hr capsule Take 75 mg by mouth daily at 12 noon.   Yes Historical Provider, MD  zolpidem (AMBIEN) 10 MG tablet Take 10 mg by mouth at bedtime. 11/11/14  Yes Historical Provider, MD  cephALEXin (KEFLEX) 250 MG capsule Take 2 capsules (500 mg total) by mouth 2 (two) times daily. Patient not taking: Reported on 11/13/2014 06/10/14   Marissa Sciacca, PA-C  furosemide (LASIX) 20 MG tablet Take 1 tablet (20 mg total) by mouth daily. Patient not taking:  Reported on 08/03/2015 11/13/14   Lorre Nick, MD  naproxen (NAPROSYN) 500 MG tablet Take 1 tablet (500 mg total) by mouth 2 (two) times daily. Patient not taking: Reported on 11/13/2014 09/08/14   Joycie Peek, PA-C   BP 134/87 mmHg  Pulse 81  Temp(Src) 98.2 F (36.8 C) (Oral)  Resp 18  Ht  (1.626 m)  Wt 190 lb (86.183 kg)  BMI 32.60 kg/m2  SpO2 96%  LMP 08/01/2015 Physical Exam  Constitutional: She is oriented to person, place, and time. She appears well-developed and well-nourished.  HENT:  Head: Normocephalic and atraumatic.  Eyes: Pupils are equal, round, and reactive to light.  Neck: Normal range of motion. Neck supple.  Cardiovascular: Normal rate, regular rhythm and normal heart sounds.   Pulmonary/Chest: Effort normal and breath sounds normal. No respiratory distress. She has no wheezes. She has no rales. She exhibits no tenderness.  Abdominal: Soft. Bowel sounds are normal. There is no tenderness. There is no rebound and no guarding.  Musculoskeletal: Normal range of motion. She exhibits no edema.  No edema or calf tenderness  Lymphadenopathy:    She has no cervical adenopathy.  Neurological: She is alert and oriented to person, place, and time.  Skin: Skin is warm and dry. No rash noted.  Psychiatric: She has a normal mood and affect.    ED Course  Procedures (including critical care time) Labs Review Labs Reviewed  BASIC METABOLIC PANEL - Abnormal; Notable for the following:    Glucose, Bld 126 (*)    All other components within normal limits  CBC  I-STAT TROPOININ, ED  Rosezena Sensor, ED    Imaging Review Dg Chest 2 View  08/03/2015  CLINICAL DATA:  Left-sided chest pain radiating into left mandible region EXAM: CHEST  2 VIEW COMPARISON:  June 10, 2014 FINDINGS: There is no edema or consolidation. The heart size and pulmonary vascularity are normal. No adenopathy. No pneumothorax. No bone lesions. IMPRESSION: No abnormality noted. Electronically  Signed   By: Bretta Bang III M.D.   On: 08/03/2015 17:03   I have personally reviewed and evaluated these images and lab results as part of my medical decision-making.   EKG Interpretation   Date/Time:  Tuesday August 03 2015 16:30:38 EST Ventricular Rate:  85 PR Interval:  151 QRS Duration: 100 QT Interval:  363 QTC Calculation: 432 R Axis:   37 Text Interpretation:  Sinus rhythm Low voltage, precordial leads Baseline  wander in lead(s) V1 since last tracing no significant change Confirmed by  Aria Pickrell  MD, Iley Breeden ([id number]) on 08/03/2015 7:31:16 PM      MDM   Final diagnoses:  Chest pain, unspecified chest pain type    Patient presents with chest pain. She also is fairly anxious. Her symptoms are nonexertional. Her chest x-ray is negative. There is no evidence of pneumothorax or pneumonia. She doesn't have other symptoms that would be more suggestive of pulmonary embolus. Her EKG is without evidence of ischemia. She's had delta troponin which have been negative. She has a low heart score of 1. Given this I feel she can be discharged with outpatient follow-up. She was instructed to follow-up with her PCP within the next few days. She was advised to return here if she has any worsening symptoms.    Rolan Bucco, MD 08/03/15 2050

## 2015-08-03 NOTE — ED Notes (Signed)
Pt reports she is complaining of left sided chest pain that radiates to her left jaw. Pain started around 11:00am this morning. Pt was sitting down, working on the computer when the pain started. Pt took PERCOCET  at 11:30 this morning and it helped the pain.

## 2015-08-13 ENCOUNTER — Ambulatory Visit: Payer: Self-pay | Admitting: Internal Medicine

## 2015-09-01 ENCOUNTER — Encounter: Payer: Self-pay | Admitting: Family Medicine

## 2015-09-03 ENCOUNTER — Ambulatory Visit: Payer: Self-pay | Admitting: Internal Medicine

## 2015-09-10 ENCOUNTER — Ambulatory Visit: Payer: 59 | Admitting: Internal Medicine

## 2015-09-15 ENCOUNTER — Ambulatory Visit (INDEPENDENT_AMBULATORY_CARE_PROVIDER_SITE_OTHER): Payer: 59 | Admitting: Neurology

## 2015-09-15 ENCOUNTER — Encounter: Payer: Self-pay | Admitting: Neurology

## 2015-09-15 VITALS — BP 140/80 | HR 76 | Resp 20 | Ht 64.0 in | Wt 199.0 lb

## 2015-09-15 DIAGNOSIS — R5382 Chronic fatigue, unspecified: Secondary | ICD-10-CM

## 2015-09-15 DIAGNOSIS — G587 Mononeuritis multiplex: Secondary | ICD-10-CM

## 2015-09-15 DIAGNOSIS — G729 Myopathy, unspecified: Secondary | ICD-10-CM | POA: Insufficient documentation

## 2015-09-15 DIAGNOSIS — R0683 Snoring: Secondary | ICD-10-CM | POA: Diagnosis not present

## 2015-09-15 DIAGNOSIS — M797 Fibromyalgia: Secondary | ICD-10-CM | POA: Insufficient documentation

## 2015-09-15 DIAGNOSIS — Z79899 Other long term (current) drug therapy: Secondary | ICD-10-CM

## 2015-09-15 DIAGNOSIS — F3130 Bipolar disorder, current episode depressed, mild or moderate severity, unspecified: Secondary | ICD-10-CM | POA: Diagnosis not present

## 2015-09-15 DIAGNOSIS — M542 Cervicalgia: Secondary | ICD-10-CM | POA: Diagnosis not present

## 2015-09-15 DIAGNOSIS — G9332 Myalgic encephalomyelitis/chronic fatigue syndrome: Secondary | ICD-10-CM | POA: Insufficient documentation

## 2015-09-15 DIAGNOSIS — F319 Bipolar disorder, unspecified: Secondary | ICD-10-CM | POA: Insufficient documentation

## 2015-09-15 NOTE — Progress Notes (Signed)
SLEEP MEDICINE CLINIC   Provider:  Melvyn Novasarmen  Coreon Simkins, M D  Referring Provider: Mirna MiresHill, Gerald, MD Primary Care Physician:  Hazel SamsValerie Lavoie, NP counselor  Chief Complaint  Patient presents with  . New Patient (Initial Visit)    neuropathy, fibromyalgia, pain, gait issues, is scared that she has MS, rm 10 with daughter    HPI:  Emily Woodard is a 35 y.o. female , seen here as a referral  from Dr. Loleta ChanceHill .  Chief complaint according to patient : " iam in chronic pain, I hurt all over and I may have chronic lyme disease"   Mrs. Emily Woodard is a 35 year old mother of 2, status post tubal pregnancy. She has for a long time suffered from anxiety and depression and is taking Klonopin, BuSpar, gabapentin, Effexor, Adderall XR at 30 mg. She just advised him that she discontinue the Effexor and is instead taking Cymbalta. She is also on amitriptyline and now at night to help with sleep. She is also placed on Lupron to treat her endometriosis. And Dr. Lavina Hammanodd Meisinger also takes care of her pain disorder, she is referred to a pain management center as of April 2. Dr. Tollie EthPlummer will see her. The patient was under the impression that to we will do a general neurologic workup today also she was scheduled for the sleep  clinic, having bipolar depression and chronic insomnia.  Mrs. Emily Woodard has reportedly severe joint pain but also deep bone pain, abdominal pain and muscle pain and nerve pain. Amitriptyline helped initially asleep but is already wearing off. She will see counselor Seymour BarsLavoie tomorrow.  Social history:  Details in her psychiatric record. Bipolar illness affecting her marriage, her job, she is not longer at work. 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 She is planning to go on short term disability. FMLA and disability based on Psychiatric illness and pain. Son, 5, Emily Woodard is autistic.    Review of Systems: Out of a complete 14 system review, the patient  complains of only the following symptoms, and all other reviewed systems are negative.  Epworth score not provided-  , Fatigue severity score not provided   , depression score PHQ9.    Social History   Social History  . Marital Status: Married    Spouse Name: N/A  . Number of Children: N/A  . Years of Education: N/A   Occupational History  . Not on file.   Social History Main Topics  . Smoking status: Former Smoker -- 1.00 packs/day for 6 years    Types: Cigarettes    Quit date: 06/26/2006  . Smokeless tobacco: Never Used  . Alcohol Use: No  . Drug Use: Yes    Special: Heroin, Marijuana, Other-see comments     Comment: recovering addict - xanax  . Sexual Activity: Not on file   Other Topics Concern  . Not on file   Social History Narrative    Family History  Problem Relation Age of Onset  . Diabetes Mother   . Hyperlipidemia Mother   . Hypertension Mother     Past Medical History  Diagnosis Date  . Abortion in first trimester 03/2000  . Depression   . Anxiety     hx panic disorder  . Asthma     no prob as adult - no inhaler  . Recurrent upper respiratory infection (URI)     05/14/2011 - tx with otc  . Seizures (HCC)     last  one 2002 - no meds  . Fibromyalgia     Past Surgical History  Procedure Laterality Date  . Dilation and evacuation  12/2001  . Colposcopy    . Wisdom tooth extraction    . Svd   07/2004, 08/2009    x 2  . Laparoscopic tubal ligation  05/23/2011    Procedure: LAPAROSCOPIC TUBAL LIGATION;  Surgeon: Zenaida Niece, MD;  Location: [facility];  Service: Gynecology;  Laterality: Bilateral;  . Iud removal  05/23/2011    Procedure: INTRAUTERINE DEVICE (IUD) REMOVAL;  Surgeon: Zenaida Niece, MD;  Location: [facility];  Service: Gynecology;;  . Endometrial ablation  05/23/2011    Procedure: ENDOMETRIAL ABLATION;  Surgeon: Zenaida Niece, MD;  Location: [facility];  Service: Gynecology;;    Current Outpatient Prescriptions  Medication Sig  Dispense Refill  . amitriptyline (ELAVIL) 50 MG tablet Take 50 mg by mouth at bedtime.    . busPIRone (BUSPAR) 15 MG tablet Take 15 mg by mouth 2 (two) times daily.  0  . clonazePAM (KLONOPIN) 0.5 MG tablet Take 0.5 mg by mouth 2 (two) times daily as needed for anxiety.     Marland Kitchen dextroamphetamine (DEXTROSTAT) 10 MG tablet Take 10 mg by mouth 3 (three) times daily.    . DULoxetine (CYMBALTA) 60 MG capsule Take 60 mg by mouth daily.    Marland Kitchen gabapentin (NEURONTIN) 300 MG capsule Take 300-900 mg by mouth See admin instructions. Take 600 mg every morning, take 600 mg every day at midday, take 900 mg every night at bedtime.  1  . Leuprolide Acetate (LUPRON IJ) Inject as directed.    . naproxen sodium (ANAPROX) 220 MG tablet Take 440 mg by mouth every 12 (twelve) hours as needed (pain).    . NORETHINDRONE PO Take by mouth.    . oxyCODONE-acetaminophen (PERCOCET/ROXICET) 5-325 MG tablet Take 1 tablet by mouth every 4 (four) hours as needed for severe pain. 8 tablet 0  . traMADol (ULTRAM) 50 MG tablet Take 1 tablet by mouth every 6 (six) hours as needed. Pain.  0   No current facility-administered medications for this visit.    Allergies as of 09/15/2015 - Review Complete 09/15/2015  Allergen Reaction Noted  . Amoxicillin  09/15/2015  . Prednisone  08/03/2015    Vitals: BP 140/80 mmHg  Pulse 76  Resp 20  Ht  (1.626 m)  Wt 199 lb (90.266 kg)  BMI 34.14 kg/m2 Last Weight:  Wt Readings from Last 1 Encounters:  09/15/15 199 lb (90.266 kg)   JXB:JYNW mass index is 34.14 kg/(m^2).     Last Height:   Ht Readings from Last 1 Encounters:  09/15/15  (1.626 m)    Physical exam:  General: The patient is awake, alert and appears not in acute distress. The patient is well groomed. Head: Normocephalic, atraumatic. Neck is supple. Mallampati 2,  neck circumference:15. Nasal airflow unrestricted , TMJ click evident . Retrognathia is not seen.  Cardiovascular:  Regular rate and rhythm , without   murmurs or carotid bruit, and without distended neck veins. Respiratory: Lungs are clear to auscultation. Skin:  Without evidence of edema, or rash Trunk: BMI is elevated - 34 plus  The patient's posture is hucnhed   Neurologic exam : The patient is awake and alert, oriented to place and time.   Memory subjective  described as impaired ,   Memory testing revealed - not performed    Attention span & concentration ability appears normal.  Speech is fluent,  without dysarthria, but  dysphonia or aphasia.  Mood and affect are tearful, depressed,  Cranial nerves: Pupils are equal and briskly reactive to light. Funduscopic exam without  evidence of pallor or edema. Extraocular movements  in vertical and horizontal planes intact and without nystagmus. Visual fields by finger perimetry are intact.Hearing to finger rub intact.   Facial sensation intact to fine touch. Facial motor strength is symmetric and tongue and uvula move midline. Shoulder shrug was symmetrical.  Motor exam: Normal tone, muscle bulk and symmetric strength in all extremities. She is very tender to any touch any place- shoulder, hip, wrist , jaw.  Sensory:  Fine touch, pinprick and vibration.  The Proprioception tested in the upper extremities was normal. Coordination: Rapid alternating movements in the fingers/hands / Finger-to-nose maneuver deliberately slow, with evidence of ataxia, dysmetria and  tremor.  Gait and station: Patient walks without assistive device and is able unassisted to climb up to the exam table. Strength within normal limits.  Stance is stable and normal.Tandem gait is unfragmented. Astasia abasia noted. Unable to stand on her heels or toes- due to pain.  Turns with 3  Steps. Romberg testing is  negative.  Deep tendon reflexes: in the  upper and lower extremities are symmetric and intact. Babinski maneuver response is downgoing.  The patient was advised of the nature of the diagnosed sleep disorder , the  treatment options and risks for general a health and wellness arising from not treating the condition.  I spent more than  55  minutes of face to face time with the patient. Greater than 50% of time was spent in counseling and coordination of care. We have discussed the diagnosis and differential and I answered the patient's questions.     Assessment:  After physical and neurologic examination, review of laboratory studies,  Personal review ofstudies, reports of other pre-existing records as far as provided in visit., my assessment is   1)  i do not see neurological abnormalities, all movement and coordination difficulties can be non organic in origen.  I like for her to have a neuropathy panel, CKMB , ANA, Hep C,   I like for the patient to take a vitamin D supplement, 2000 units a day should be fine, I usually like my younger patients to take a prenatal vitamin even if the not pregnant as it helps nerve healing in neuropathy in dysesthesia. It also helps to give energy.  2) Mrs. Lechtenberg is concerned that a central nervous system process may have caused her cognitive impairment that by now best on her subjective report. If her psychiatric counselor and psychiatrist are in agreement I will order an MRI of the brain to evaluate for anatomical abnormalities. She has a lot of neck pain , radiating to the occipital area . MRI c spine.   3) No Pain treatment at [facility] !  She needs to lose weight urgently .Marland Kitchen   Plan:  Treatment plan and additional workup :  If the labs are normal and MRI is normal, Mrs Baxley will be seen by NP to perform MMSE and MOCA.   Dr Renaye Rakers, Todd Meisinger, Hazel Sams.   Porfirio Mylar Bostyn Bogie MD  09/15/2015   CC: Mirna Mires, Md [address] [place], [place] [address]

## 2015-09-15 NOTE — Patient Instructions (Addendum)
Insomnia Insomnia is a sleep disorder that makes it difficult to fall asleep or to stay asleep. Insomnia can cause tiredness (fatigue), low energy, difficulty concentrating, mood swings, and poor performance at work or school.  There are three different ways to classify insomnia:  Difficulty falling asleep.  Difficulty staying asleep.  Waking up too early in the morning. Any type of insomnia can be long-term (chronic) or short-term (acute). Both are common. Short-term insomnia usually lasts for three months or less. Chronic insomnia occurs at least three times a week for longer than three months. CAUSES  Insomnia may be caused by another condition, situation, or substance, such as:  Anxiety.  Certain medicines.  Gastroesophageal reflux disease (GERD) or other gastrointestinal conditions.  Asthma or other breathing conditions.  Restless legs syndrome, sleep apnea, or other sleep disorders.  Chronic pain.  Menopause. This may include hot flashes.  Stroke.  Abuse of alcohol, tobacco, or illegal drugs.  Depression.  Caffeine.   Neurological disorders, such as Alzheimer disease.  An overactive thyroid (hyperthyroidism). The cause of insomnia may not be known. RISK FACTORS Risk factors for insomnia include:  Gender. Women are more commonly affected than men.  Age. Insomnia is more common as you get older.  Stress. This may involve your professional or personal life.  Income. Insomnia is more common in people with lower income.  Lack of exercise.   Irregular work schedule or night shifts.  Traveling between different time zones. SIGNS AND SYMPTOMS If you have insomnia, trouble falling asleep or trouble staying asleep is the main symptom. This may lead to other symptoms, such as:  Feeling fatigued.  Feeling nervous about going to sleep.  Not feeling rested in the morning.  Having trouble concentrating.  Feeling irritable, anxious, or depressed. TREATMENT   Treatment for insomnia depends on the cause. If your insomnia is caused by an underlying condition, treatment will focus on addressing the condition. Treatment may also include:   Medicines to help you sleep.  Counseling or therapy.  Lifestyle adjustments. HOME CARE INSTRUCTIONS   Take medicines only as directed by your health care provider.  Keep regular sleeping and waking hours. Avoid naps.  Keep a sleep diary to help you and your health care provider figure out what could be causing your insomnia. Include:   When you sleep.  When you wake up during the night.  How well you sleep.   How rested you feel the next day.  Any side effects of medicines you are taking.  What you eat and drink.   Make your bedroom a comfortable place where it is easy to fall asleep:  Put up shades or special blackout curtains to block light from outside.  Use a white noise machine to block noise.  Keep the temperature cool.   Exercise regularly as directed by your health care provider. Avoid exercising right before bedtime.  Use relaxation techniques to manage stress. Ask your health care provider to suggest some techniques that may work well for you. These may include:  Breathing exercises.  Routines to release muscle tension.  Visualizing peaceful scenes.  Cut back on alcohol, caffeinated beverages, and cigarettes, especially close to bedtime. These can disrupt your sleep.  Do not overeat or eat spicy foods right before bedtime. This can lead to digestive discomfort that can make it hard for you to sleep.  Limit screen use before bedtime. This includes:  Watching TV.  Using your smartphone, tablet, and computer.  Stick to a routine. This   can help you fall asleep faster. Try to do a quiet activity, brush your teeth, and go to bed at the same time each night.  Get out of bed if you are still awake after 15 minutes of trying to sleep. Keep the lights down, but try reading or  doing a quiet activity. When you feel sleepy, go back to bed.  Make sure that you drive carefully. Avoid driving if you feel very sleepy.  Keep all follow-up appointments as directed by your health care provider. This is important. SEEK MEDICAL CARE IF:   You are tired throughout the day or have trouble in your daily routine due to sleepiness.  You continue to have sleep problems or your sleep problems get worse. SEEK IMMEDIATE MEDICAL CARE IF:   You have serious thoughts about hurting yourself or someone else.   This information is not intended to replace advice given to you by your health care provider. Make sure you discuss any questions you have with your health care provider.   Document Released: 06/09/2000 Document Revised: 03/03/2015 Document Reviewed: 03/13/2014 Elsevier Interactive Patient Education 2016 [employer]  Please remember to try to maintain good sleep hygiene, which means: Keep a regular sleep and wake schedule, try not to exercise or have a meal within 2 hours of your bedtime, try to keep your bedroom conducive for sleep, that is, cool and dark, without light distractors such as an illuminated alarm clock, and refrain from watching TV right before sleep or in the middle of the night and do not keep the TV or radio on during the night. Also, try not to use or play on electronic devices at bedtime, such as your cell phone, tablet PC or laptop. If you like to read at bedtime on an electronic device, try to dim the background light as much as possible. Do not eat in the middle of the night.   We will request a sleep study.    We will look for leg twitching and snoring or sleep apnea.   For chronic insomnia, you are best followed by a psychiatrist and/or sleep psychologist.   We will call you with the sleep study results and make a follow up appointment if needed.    

## 2015-09-16 ENCOUNTER — Telehealth: Payer: Self-pay | Admitting: Neurology

## 2015-09-16 NOTE — Telephone Encounter (Signed)
I am not responsible for this failing plan, My work is not completed,.

## 2015-09-16 NOTE — Telephone Encounter (Signed)
Patient called to advise, she left job on short term disability, her Psychotherapist at [facility] has filled out the paperwork however insurance company will not accept from Psychotherapist due to one of the diagnosis being fibromyalgia. Psychotherapist recommended Dr. Vickey Hugerohmeier may be able to fill out the paperwork. Loletta ParishSedgwick [phone], is giving patient until 27th to turn in paperwork. Patient advised of our 14 day turn around time and $25 form fee. Patient will request extension, however Loletta ParishSedgwick will need letter from Dr. Vickey Hugerohmeier stating we are just now getting this information. Patient states, they cannot pay her, until they receive paperwork. Patient states she has 2 children and a disabled husband at home. Please call [phone] to advise.

## 2015-09-16 NOTE — Telephone Encounter (Signed)
Spoke to pt and advised her that Dr. Vickey Hugerohmeier cannot fill out her disability paperwork and she needs her PCP and psychiatrist to fill it out.  Pt became very upset, saying that since Dr. Vickey Hugerohmeier is completing a neurological work up, she needs to fill out the disability paperwork so she doesn't lose her job and benefits. She says the paperwork needs to be filled out next week.  She says that she is going to have Vikki PortsValerie, NP reach out to Dr. Vickey Hugerohmeier to discuss disability paperwork and she wants a phone call from Dr. Vickey Hugerohmeier to discuss her disability forms.

## 2015-09-16 NOTE — Telephone Encounter (Signed)
I cannot fill out disability forms if I have no findings of disability.

## 2015-09-16 NOTE — Telephone Encounter (Signed)
No, have seen her once , no focal neurological finding. Needs PCP and Psychiatry to fill this .

## 2015-09-16 NOTE — Telephone Encounter (Signed)
Do you want to fill out disability paperwork for pt's fibromyalgia?

## 2015-09-17 LAB — HEPATITIS C ANTIBODY (REFLEX): HCV AB: 0.1 {s_co_ratio} (ref 0.0–0.9)

## 2015-09-17 LAB — CK TOTAL AND CKMB (NOT AT ARMC)
CK-MB Index: 3.6 ng/mL (ref 0.0–5.3)
Total CK: 334 U/L — ABNORMAL HIGH (ref 24–173)

## 2015-09-17 LAB — PROTEIN ELECTROPHORESIS, SERUM, WITH REFLEX
A/G Ratio: 1.2 (ref 0.7–1.7)
ALBUMIN ELP: 4.1 g/dL (ref 2.9–4.4)
ALPHA 1: 0.2 g/dL (ref 0.0–0.4)
Alpha 2: 0.9 g/dL (ref 0.4–1.0)
Beta: 1.4 g/dL — ABNORMAL HIGH (ref 0.7–1.3)
Gamma Globulin: 0.9 g/dL (ref 0.4–1.8)
Globulin, Total: 3.4 g/dL (ref 2.2–3.9)
TOTAL PROTEIN: 7.5 g/dL (ref 6.0–8.5)

## 2015-09-17 LAB — ANA W/REFLEX IF POSITIVE: Anti Nuclear Antibody(ANA): NEGATIVE

## 2015-09-17 LAB — HCV COMMENT:

## 2015-09-20 ENCOUNTER — Telehealth: Payer: Self-pay

## 2015-09-20 NOTE — Telephone Encounter (Signed)
PCP to refer to pain clinic.

## 2015-09-20 NOTE — Telephone Encounter (Signed)
I called pt to discuss lab results and disability paperwork. No answer, left a message asking her to call me back.

## 2015-09-20 NOTE — Telephone Encounter (Signed)
I spoke to pt and advised her that her labs showed a negative ANA, negative hep C, and elevated muscle enzymes. I advised her that Dr. Vickey Hugerohmeier recommended these labs be faxed to Dr. Parke SimmersBland, PCP. Pt verbalized understanding.   Pt is asking if Dr. Vickey Hugerohmeier will send a referral to [facility]mer [facility] for pain management for the pt.

## 2015-09-20 NOTE — Telephone Encounter (Signed)
-----   Message from Melvyn Novasarmen Dohmeier, MD sent at 09/16/2015  4:00 PM EDT ----- Negative ANA- this rules out a lot of autoimmune disorders. Hepatitis C was negative . Elevated muscle enzyme 333 U/liter- see PCP and possibly  Rheumatology. CD

## 2015-09-21 NOTE — Telephone Encounter (Signed)
I spoke to pt and advised her that Dr. Vickey Hugerohmeier would rather pt's PCP set up a referral to pain management. Pt verbalized understanding. 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 Vickey Hugerohmeier is actually going to "treat her for what she is experiencing or just do diagnostics"? I advised her that when her MRI results and HST results are in, Dr. Vickey Hugerohmeier will formulate a plan of care. Pt verbalized understanding.

## 2015-09-29 ENCOUNTER — Ambulatory Visit
Admission: RE | Admit: 2015-09-29 | Discharge: 2015-09-29 | Disposition: A | Discharge status: Home / Self Care | Payer: 59 | Source: Ambulatory Visit | Attending: Neurology | Admitting: Neurology

## 2015-09-29 DIAGNOSIS — F319 Bipolar disorder, unspecified: Secondary | ICD-10-CM

## 2015-09-29 DIAGNOSIS — G9332 Myalgic encephalomyelitis/chronic fatigue syndrome: Secondary | ICD-10-CM

## 2015-09-29 DIAGNOSIS — R5382 Chronic fatigue, unspecified: Secondary | ICD-10-CM

## 2015-09-29 DIAGNOSIS — G729 Myopathy, unspecified: Secondary | ICD-10-CM

## 2015-09-29 DIAGNOSIS — G587 Mononeuritis multiplex: Secondary | ICD-10-CM

## 2015-09-29 DIAGNOSIS — M797 Fibromyalgia: Secondary | ICD-10-CM

## 2015-09-29 DIAGNOSIS — Z79899 Other long term (current) drug therapy: Secondary | ICD-10-CM

## 2015-09-29 DIAGNOSIS — M542 Cervicalgia: Secondary | ICD-10-CM

## 2015-10-04 ENCOUNTER — Inpatient Hospital Stay (HOSPITAL_COMMUNITY)
Admission: AD | Admit: 2015-10-04 | Discharge: 2015-10-04 | Disposition: A | Discharge status: Home / Self Care | Payer: 59 | Source: Ambulatory Visit | Attending: Obstetrics and Gynecology | Admitting: Obstetrics and Gynecology

## 2015-10-04 ENCOUNTER — Encounter (HOSPITAL_COMMUNITY): Payer: Self-pay | Admitting: *Deleted

## 2015-10-04 DIAGNOSIS — Z87891 Personal history of nicotine dependence: Secondary | ICD-10-CM | POA: Diagnosis not present

## 2015-10-04 DIAGNOSIS — F329 Major depressive disorder, single episode, unspecified: Secondary | ICD-10-CM | POA: Diagnosis not present

## 2015-10-04 DIAGNOSIS — R109 Unspecified abdominal pain: Secondary | ICD-10-CM | POA: Diagnosis present

## 2015-10-04 DIAGNOSIS — R569 Unspecified convulsions: Secondary | ICD-10-CM | POA: Diagnosis not present

## 2015-10-04 DIAGNOSIS — M797 Fibromyalgia: Secondary | ICD-10-CM | POA: Insufficient documentation

## 2015-10-04 DIAGNOSIS — F1120 Opioid dependence, uncomplicated: Secondary | ICD-10-CM

## 2015-10-04 DIAGNOSIS — R102 Pelvic and perineal pain: Secondary | ICD-10-CM | POA: Insufficient documentation

## 2015-10-04 DIAGNOSIS — F419 Anxiety disorder, unspecified: Secondary | ICD-10-CM | POA: Diagnosis not present

## 2015-10-04 DIAGNOSIS — F410 Panic disorder [episodic paroxysmal anxiety] without agoraphobia: Secondary | ICD-10-CM | POA: Diagnosis not present

## 2015-10-04 HISTORY — DX: Endometriosis, unspecified: N80.9

## 2015-10-04 LAB — URINALYSIS, ROUTINE W REFLEX MICROSCOPIC
Bilirubin Urine: NEGATIVE
GLUCOSE, UA: NEGATIVE mg/dL
Hgb urine dipstick: NEGATIVE
Ketones, ur: 15 mg/dL — AB
Nitrite: NEGATIVE
PROTEIN: NEGATIVE mg/dL
pH: 5.5 (ref 5.0–8.0)

## 2015-10-04 LAB — URINE MICROSCOPIC-ADD ON: RBC / HPF: NONE SEEN RBC/hpf (ref 0–5)

## 2015-10-04 LAB — POCT PREGNANCY, URINE: PREG TEST UR: NEGATIVE

## 2015-10-04 MED ORDER — OXYCODONE-ACETAMINOPHEN 5-325 MG PO TABS: 1.0000 | ORAL_TABLET | ORAL | Status: DC | PRN

## 2015-10-04 NOTE — MAU Note (Signed)
Under the care of Dr Jackelyn KnifeMeisinger, being treated for severe endometriosis. Scheduled for hysterectomy in June. Has been getting medication every Monday.  Dr Jackelyn KnifeMeisinger is out of the office this week, the other dr's will not refill narcotics

## 2015-10-04 NOTE — Discharge Instructions (Signed)
You were seen for a refill of your percocet. Your OB GYN office agreed to allow us to fill. You should always call and attempt to go to your office when this happens. The MAU will not continue to provide additional narcotic prescriptions.

## 2015-10-04 NOTE — MAU Provider Note (Signed)
History     CSN: [id number] Arrival date and time: 10/04/15 1122 First Provider Initiated Contact with Patient 10/04/15 1252     Chief Complaint  Patient presents with  . Abdominal Pain   HPI Patient has a history of endometriosis, receiving chronic pain medications from OBGYN. Has surgery scheduled 6/12 for hysterectomy. Reports she typically gets rx on mondays but provider is out of town and patient cannot be seen until Wednesday.  She has tramadol as well for pelvic pain.   OB History    Gravida Para Term Preterm AB TAB SAB Ectopic Multiple Living   Past Medical History  Diagnosis Date  . Abortion in first trimester 03/2000  . Depression   . Anxiety     hx panic disorder  . Recurrent upper respiratory infection (URI)     05/14/2011 - tx with otc  . Seizures (HCC)     last one 2002 - no meds  . Fibromyalgia   . Endometriosis     Past Surgical History  Procedure Laterality Date  . Dilation and evacuation  12/2001  . Colposcopy    . Wisdom tooth extraction    . Svd   07/2004, 08/2009    x 2  . Laparoscopic tubal ligation  05/23/2011    Procedure: LAPAROSCOPIC TUBAL LIGATION;  Surgeon: Zenaida Niece, MD;  Location: [facility];  Service: Gynecology;  Laterality: Bilateral;  . Iud removal  05/23/2011    Procedure: INTRAUTERINE DEVICE (IUD) REMOVAL;  Surgeon: Zenaida Niece, MD;  Location: [facility];  Service: Gynecology;;  . Endometrial ablation  05/23/2011    Procedure: ENDOMETRIAL ABLATION;  Surgeon: Zenaida Niece, MD;  Location: [facility];  Service: Gynecology;;    Family History  Problem Relation Age of Onset  . Diabetes Mother   . Hyperlipidemia Mother   . Hypertension Mother     Social History  Substance Use Topics  . Smoking status: Former Smoker -- 1.00 packs/day for 6 years    Types: Cigarettes    Quit date: 06/26/2006  . Smokeless tobacco: Never Used  . Alcohol Use: No    Allergies:  Allergies  Allergen Reactions  . Amoxicillin    . Prednisone     "I just can't take them. They make me feel bad."    Prescriptions prior to admission  Medication Sig Dispense Refill Last Dose  . amitriptyline (ELAVIL) 50 MG tablet Take 50 mg by mouth at bedtime.   Taking  . busPIRone (BUSPAR) 15 MG tablet Take 15 mg by mouth 2 (two) times daily.  0 Taking  . clonazePAM (KLONOPIN) 0.5 MG tablet Take 0.5 mg by mouth 2 (two) times daily as needed for anxiety.    Taking  . dextroamphetamine (DEXTROSTAT) 10 MG tablet Take 10 mg by mouth 3 (three) times daily.   Taking  . DULoxetine (CYMBALTA) 60 MG capsule Take 60 mg by mouth daily.   Taking  . gabapentin (NEURONTIN) 300 MG capsule Take 300-900 mg by mouth See admin instructions. Take 600 mg every morning, take 600 mg every day at midday, take 900 mg every night at bedtime.  1 Taking  . Leuprolide Acetate (LUPRON IJ) Inject as directed.   Taking  . naproxen sodium (ANAPROX) 220 MG tablet Take 440 mg by mouth every 12 (twelve) hours as needed (pain).   Taking  . NORETHINDRONE PO Take by mouth.   Taking  .  oxyCODONE-acetaminophen (PERCOCET/ROXICET) 5-325 MG tablet Take 1 tablet by mouth every 4 (four) hours as needed for severe pain. 8 tablet 0 Taking  . traMADol (ULTRAM) 50 MG tablet Take 1 tablet by mouth every 6 (six) hours as needed. Pain.  0 Taking    Review of Systems  Constitutional: Negative for fever and chills.  Eyes: Negative for blurred vision and double vision.  Respiratory: Negative for cough and shortness of breath.   Cardiovascular: Negative for chest pain and orthopnea.  Gastrointestinal: Positive for abdominal pain (chronic). Negative for nausea and vomiting.  Genitourinary: Negative for dysuria, frequency and flank pain.  Musculoskeletal: Negative for myalgias.  Skin: Negative for rash.  Neurological: Negative for dizziness, tingling, weakness and headaches.  Endo/Heme/Allergies: Does not bruise/bleed easily.  Psychiatric/Behavioral: Negative for depression and  suicidal ideas. The patient is not nervous/anxious.    Physical Exam   Blood pressure 115/75, pulse 89, temperature 98 F (36.7 C), temperature source Oral, resp. rate 18, weight 206 lb (93.441 kg), last menstrual period 09/08/2015.  Physical Exam  Nursing note and vitals reviewed. Constitutional: She is oriented to person, place, and time. She appears well-developed and well-nourished. No distress.  HENT:  Head: Normocephalic and atraumatic.  Eyes: Conjunctivae are normal. No scleral icterus.  Neck: Normal range of motion. Neck supple.  Cardiovascular: Normal rate and intact distal pulses.   Respiratory: Effort normal. She exhibits no tenderness.  GI: Soft. There is no tenderness. There is no rebound and no guarding.  Genitourinary: Vagina normal.  Musculoskeletal: Normal range of motion. She exhibits no edema.  Neurological: She is alert and oriented to person, place, and time.  Skin: Skin is warm and dry. No rash noted.  Psychiatric: She has a normal mood and affect.    MAU Course  Procedures  MDM Patient receives #45 tablets weekly from Dr. Jackelyn KnifeMeisinger every Monday. Confirmed with Dr. Ellyn HackBovard that patient is receiving rx for percocet and she agreed with interim fill here in MAU   Assessment and Plan   Chronic pelvic pain -Prescribed #12 tabs percocet  -Discussed use of MAU and that this is not a place to received refills of narcotics.   Isa RankinKimberly Niles [facility] 10/04/2015, 12:53 PM

## 2015-10-05 ENCOUNTER — Encounter (HOSPITAL_COMMUNITY): Payer: Self-pay

## 2015-10-05 ENCOUNTER — Emergency Department (HOSPITAL_COMMUNITY)
Admission: EM | Admit: 2015-10-05 | Discharge: 2015-10-06 | Disposition: A | Discharge status: Home / Self Care | Payer: 59 | Attending: Emergency Medicine | Admitting: Emergency Medicine

## 2015-10-05 DIAGNOSIS — F13230 Sedative, hypnotic or anxiolytic dependence with withdrawal, uncomplicated: Secondary | ICD-10-CM | POA: Diagnosis not present

## 2015-10-05 DIAGNOSIS — M797 Fibromyalgia: Secondary | ICD-10-CM | POA: Diagnosis not present

## 2015-10-05 DIAGNOSIS — Z8742 Personal history of other diseases of the female genital tract: Secondary | ICD-10-CM | POA: Insufficient documentation

## 2015-10-05 DIAGNOSIS — F419 Anxiety disorder, unspecified: Secondary | ICD-10-CM | POA: Insufficient documentation

## 2015-10-05 DIAGNOSIS — Z8709 Personal history of other diseases of the respiratory system: Secondary | ICD-10-CM | POA: Insufficient documentation

## 2015-10-05 DIAGNOSIS — F13939 Sedative, hypnotic or anxiolytic use, unspecified with withdrawal, unspecified: Secondary | ICD-10-CM

## 2015-10-05 DIAGNOSIS — R251 Tremor, unspecified: Secondary | ICD-10-CM | POA: Diagnosis present

## 2015-10-05 DIAGNOSIS — F329 Major depressive disorder, single episode, unspecified: Secondary | ICD-10-CM | POA: Diagnosis not present

## 2015-10-05 DIAGNOSIS — Z880 Allergy status to penicillin: Secondary | ICD-10-CM | POA: Diagnosis not present

## 2015-10-05 DIAGNOSIS — Z87891 Personal history of nicotine dependence: Secondary | ICD-10-CM | POA: Insufficient documentation

## 2015-10-05 DIAGNOSIS — F13239 Sedative, hypnotic or anxiolytic dependence with withdrawal, unspecified: Secondary | ICD-10-CM

## 2015-10-05 DIAGNOSIS — Z79899 Other long term (current) drug therapy: Secondary | ICD-10-CM | POA: Insufficient documentation

## 2015-10-05 NOTE — ED Notes (Signed)
Pt was taking clonazepam and was taking off those and put on xanax, trying to come off xanax she decided to just stop and has ben in withdrawals for two days, pt has the shakes and tremors and is very anxious

## 2015-10-06 ENCOUNTER — Encounter: Payer: 59 | Admitting: Neurology

## 2015-10-06 ENCOUNTER — Other Ambulatory Visit: Payer: 59

## 2015-10-06 MED ORDER — ALPRAZOLAM 0.5 MG PO TABS
1.0000 mg | ORAL_TABLET | Freq: Once | ORAL | Status: AC
  Administered 2015-10-06: 1 mg via ORAL
  Filled 2015-10-06: qty 2

## 2015-10-06 MED ORDER — ALPRAZOLAM 1 MG PO TABS: ORAL_TABLET | ORAL | Status: DC

## 2015-10-06 NOTE — Discharge Instructions (Signed)
Benzodiazepine Withdrawal  °Benzodiazepines are a group of drugs that are prescribed for both short-term and long-term treatment of a variety of medical conditions. For some of these conditions, such as seizures and sudden and severe muscle spasms, they are used only for a few hours or a few days. For other conditions, such as anxiety, sleep problems, or frequent muscle spasms or to help prevent seizures, they are used for an extended period, usually weeks or months. °Benzodiazepines work by changing the way your brain functions. Normally, chemicals in your brain called neurotransmitters send messages between your brain cells. The neurotransmitter that benzodiazepines affect is called gamma-aminobutyric acid (GABA). GABA sends out messages that have a calming effect on many of the functions of your brain. Benzodiazepines make these messages stronger and increase this calming effect. °Short-term use of benzodiazepines usually does not cause problems when you stop taking the drugs. However, if you take benzodiazepines for a long time, your body can adjust to the drug and require more of it to produce the same effect (drug tolerance). Eventually, you can develop physical dependence on benzodiazepines, which is when you experience negative effects if your dosage of benzodiazepines is reduced or stopped too quickly. These negative effects are called symptoms of withdrawal. °SYMPTOMS °Symptoms of withdrawal may begin anytime within the first 10 days after you stop taking the benzodiazepine. They can last from several weeks up to a few months but usually are the worst between the first 10 to 14 days.  °The actual symptoms also vary, depending on the type of benzodiazepine you take. Possible symptoms include: °· Anxiety. °· Excitability. °· Irritability. °· Depression. °· Mood swings. °· Trouble sleeping. °· Confusion. °· Uncontrollable shaking (tremors). °· Muscle weakness. °· Seizures. °DIAGNOSIS °To diagnose  benzodiazepine withdrawal, your caregiver will examine you for certain signs, such as: °· Rapid heartbeat. °· Rapid breathing. °· Tremors. °· High blood pressure. °· Fever. °· Mood changes. °Your caregiver also may ask the following questions about your use of benzodiazepines: °· What type of benzodiazepine did you take? °· How much did you take each day? °· How long did you take the drug? °· When was the last time you took the drug? °· Do you take any other drugs? °· Have you had alcohol recently? °· Have you had a seizure recently? °· Have you lost consciousness recently? °· Have you had trouble remembering recent events? °· Have you had a recent increase in anxiety, irritability, or trouble sleeping? °A drug test also may be administered. °TREATMENT °The treatment for benzodiazepine withdrawal can vary, depending on the type and severity of your symptoms, what type of benzodiazepine you have been taking, and how long you have been taking the benzodiazepine. Sometimes it is necessary for you to be treated in a hospital, especially if you are at risk of seizures.  °Often, treatment includes a prescription for a long-acting benzodiazepine, the dosage of which is reduced slowly over a long period. This period could be several weeks or months. Eventually, your dosage will be reduced to a point that you can stop taking the drug, without experiencing withdrawal symptoms. This is called tapered withdrawal. Occasionally, minor symptoms of withdrawal continue for a few days or weeks after you have completed a tapered withdrawal. °SEEK IMMEDIATE MEDICAL CARE IF: °· You have a seizure. °· You develop a craving for drugs or alcohol. °· You begin to experience symptoms of withdrawal during your tapered withdrawal. °· You become very confused. °· You lose consciousness. °· You   have trouble breathing. °· You think about hurting yourself or someone else. °  °This information is not intended to replace advice given to you by your  health care provider. Make sure you discuss any questions you have with your health care provider. °  °Document Released: 06/01/2011 Document Revised: 07/03/2014 Document Reviewed: 12/02/2014 °Elsevier Interactive Patient Education ©2016 [employer] ° °

## 2015-10-06 NOTE — ED Provider Notes (Signed)
CSN: [id number]     Arrival date & time 10/05/15  2223 History   First MD Initiated Contact with Patient 10/06/15 0149     Chief Complaint  Patient presents with  . Delirium Tremens (DTS)     (Consider location/radiation/quality/duration/timing/severity/associated sxs/prior Treatment) HPI Comments: The patient presents with complaint of tremors she thinks is related to stopping her Xanax abruptly. She had been taking Clonazepam longterm and was switched to Xanax about 3 weeks ago, prescribed 1 mg up to TID prn for anxiety. She has wanted to come off the medication completely and stopped taking it 4 days ago without tapering. She reports she threw her medication away. Over the last 4 days she has developed a generalized, involuntary tremor and nausea. No vomiting, confusion, seizures.   The history is provided by the patient. No language interpreter was used.    Past Medical History  Diagnosis Date  . Abortion in first trimester 03/2000  . Depression   . Anxiety     hx panic disorder  . Recurrent upper respiratory infection (URI)     05/14/2011 - tx with otc  . Seizures (HCC)     last one 2002 - no meds  . Fibromyalgia   . Endometriosis    Past Surgical History  Procedure Laterality Date  . Dilation and evacuation  12/2001  . Colposcopy    . Wisdom tooth extraction    . Svd   07/2004, 08/2009    x 2  . Laparoscopic tubal ligation  05/23/2011    Procedure: LAPAROSCOPIC TUBAL LIGATION;  Surgeon: Zenaida Nieceodd D Meisinger, MD;  Location: [facility];  Service: Gynecology;  Laterality: Bilateral;  . Iud removal  05/23/2011    Procedure: INTRAUTERINE DEVICE (IUD) REMOVAL;  Surgeon: Zenaida Nieceodd D Meisinger, MD;  Location: [facility];  Service: Gynecology;;  . Endometrial ablation  05/23/2011    Procedure: ENDOMETRIAL ABLATION;  Surgeon: Zenaida Nieceodd D Meisinger, MD;  Location: [facility];  Service: Gynecology;;   Family History  Problem Relation Age of Onset  . Diabetes Mother   . Hyperlipidemia Mother   .  Hypertension Mother    Social History  Substance Use Topics  . Smoking status: Former Smoker -- 1.00 packs/day for 6 years    Types: Cigarettes    Quit date: 06/26/2006  . Smokeless tobacco: Never Used  . Alcohol Use: No   OB History    Gravida Para Term Preterm AB TAB SAB Ectopic Multiple Living   4 2 2  1   1  2      Review of Systems  Constitutional: Negative for fever and chills.  HENT: Negative.   Respiratory: Negative.   Cardiovascular: Negative.   Gastrointestinal: Positive for nausea.  Musculoskeletal: Negative.   Skin: Negative.   Neurological: Positive for tremors.  Psychiatric/Behavioral: The patient is nervous/anxious.       Allergies  Prednisone and Amoxicillin  Home Medications   Prior to Admission medications   Medication Sig Start Date End Date Taking? Authorizing Provider  amitriptyline (ELAVIL) 100 MG tablet Take 100 mg by mouth at bedtime.   Yes Historical Provider, MD  busPIRone (BUSPAR) 15 MG tablet Take 15 mg by mouth 2 (two) times daily. 07/01/15  Yes Historical Provider, MD  cyclobenzaprine (FLEXERIL) 10 MG tablet Take 10 mg by mouth 3 (three) times daily as needed for muscle spasms.   Yes Historical Provider, MD  DULoxetine (CYMBALTA) 60 MG capsule Take 120 mg by mouth daily.    Yes Historical Provider, MD  gabapentin (NEURONTIN) 300 MG capsule Take 300 mg by mouth 3 (three) times daily. Take 600 mg every morning, take 600 mg every day at midday, take 900 mg every night at bedtime. 11/11/14  Yes Historical Provider, MD  ibuprofen (ADVIL,MOTRIN) 200 MG tablet Take 800 mg by mouth every 6 (six) hours as needed for headache, mild pain or moderate pain.    Yes Historical Provider, MD  oxyCODONE-acetaminophen (PERCOCET/ROXICET) 5-325 MG tablet Take 1 tablet by mouth every 4 (four) hours as needed for severe pain. 10/04/15  Yes Federico Flake, MD  traMADol (ULTRAM) 50 MG tablet Take 50 mg by mouth every 6 (six) hours as needed for moderate pain or  severe pain. Pain. 07/26/15  Yes Historical Provider, MD  ALPRAZolam Prudy Feeler) 1 MG tablet Take 1 mg up to three times daily on days 1 and 2 Then take 1 mg up to twice daily on days 3, 4 and 5 Then take 1 mg once daily on days 6, 7 and 8 After this 8 day course you should be off the medication without complication 10/06/15   Elpidio Anis, PA-C   BP 123/87 mmHg  Pulse 93  Temp(Src) 98.2 F (36.8 C) (Oral)  Resp 16  SpO2 99%  LMP 09/08/2015 (Approximate) Physical Exam  Constitutional: She is oriented to person, place, and time. She appears well-developed and well-nourished.  HENT:  Head: Normocephalic.  Neck: Normal range of motion. Neck supple.  Cardiovascular: Normal rate and regular rhythm.   Pulmonary/Chest: Effort normal and breath sounds normal.  Abdominal: Soft. Bowel sounds are normal. There is no tenderness. There is no rebound and no guarding.  Musculoskeletal: Normal range of motion.  Neurological: She is alert and oriented to person, place, and time.  Tremor of UE's and facial muscle fasciculations noted. Alert, oriented, cooperative. Preserved coordination, including fine motor function. CN's 3-12 grossly intact. Ambulatory without imbalance.   Skin: Skin is warm and dry. No rash noted.  Psychiatric: She has a normal mood and affect.    ED Course  Procedures (including critical care time) Labs Review Labs Reviewed - No data to display  Imaging Review No results found. I have personally reviewed and evaluated these images and lab results as part of my medical decision-making.   EKG Interpretation None      MDM   Final diagnoses:  Benzodiazepine withdrawal, with unspecified complication (HCC)    The patient appears stable with signs of withdrawal from benzodiazapines. Discussed with Dr. Blinda Leatherwood. Will prescribe a taper dose of Xanax over 8 days and encourage follow up with her prescribing physician in the interim to monitor her symptoms.     Elpidio Anis,  PA-C 10/06/15 1610  Gilda Crease, MD 10/07/15 [phone]

## 2015-10-07 ENCOUNTER — Encounter: Payer: Self-pay | Admitting: Neurology

## 2015-10-08 ENCOUNTER — Other Ambulatory Visit: Payer: 59

## 2015-10-08 ENCOUNTER — Inpatient Hospital Stay: Admission: RE | Admit: 2015-10-08 | Payer: 59 | Source: Ambulatory Visit

## 2015-10-08 ENCOUNTER — Encounter: Payer: Self-pay | Admitting: *Deleted

## 2015-10-11 ENCOUNTER — Ambulatory Visit: Payer: 59 | Admitting: Cardiovascular Disease

## 2015-10-11 ENCOUNTER — Encounter: Payer: Self-pay | Admitting: *Deleted

## 2015-10-18 ENCOUNTER — Other Ambulatory Visit: Payer: 59

## 2015-10-18 ENCOUNTER — Inpatient Hospital Stay: Admission: RE | Admit: 2015-10-18 | Payer: 59 | Source: Ambulatory Visit

## 2015-10-24 ENCOUNTER — Other Ambulatory Visit: Payer: 59

## 2015-10-24 ENCOUNTER — Inpatient Hospital Stay: Admission: RE | Admit: 2015-10-24 | Payer: 59 | Source: Ambulatory Visit

## 2015-10-25 DIAGNOSIS — Z765 Malingerer [conscious simulation]: Secondary | ICD-10-CM

## 2015-10-25 DIAGNOSIS — F1110 Opioid abuse, uncomplicated: Secondary | ICD-10-CM

## 2015-10-25 HISTORY — DX: Malingerer (conscious simulation): Z76.5

## 2015-10-25 HISTORY — DX: Opioid abuse, uncomplicated: F11.10

## 2015-11-08 ENCOUNTER — Telehealth: Payer: Self-pay | Admitting: Adult Health

## 2015-11-08 NOTE — Telephone Encounter (Signed)
Emily Woodard   Physician:  SATER  [phone] *DOB:  [birth year]  WOULD LIKE TO RESHEDULE AN APPT SHE CANCELLED   I called pt back and left message to call office.

## 2015-11-13 ENCOUNTER — Inpatient Hospital Stay (HOSPITAL_COMMUNITY)
Admission: AD | Admit: 2015-11-13 | Discharge: 2015-11-13 | Disposition: A | Discharge status: Home / Self Care | Payer: 59 | Source: Ambulatory Visit | Attending: Obstetrics and Gynecology | Admitting: Obstetrics and Gynecology

## 2015-11-13 ENCOUNTER — Encounter (HOSPITAL_COMMUNITY): Payer: Self-pay | Admitting: *Deleted

## 2015-11-13 DIAGNOSIS — Z833 Family history of diabetes mellitus: Secondary | ICD-10-CM | POA: Diagnosis not present

## 2015-11-13 DIAGNOSIS — Z825 Family history of asthma and other chronic lower respiratory diseases: Secondary | ICD-10-CM | POA: Insufficient documentation

## 2015-11-13 DIAGNOSIS — R1084 Generalized abdominal pain: Secondary | ICD-10-CM | POA: Diagnosis not present

## 2015-11-13 DIAGNOSIS — G8929 Other chronic pain: Secondary | ICD-10-CM | POA: Insufficient documentation

## 2015-11-13 DIAGNOSIS — N803 Endometriosis of pelvic peritoneum: Secondary | ICD-10-CM | POA: Diagnosis not present

## 2015-11-13 DIAGNOSIS — F329 Major depressive disorder, single episode, unspecified: Secondary | ICD-10-CM | POA: Insufficient documentation

## 2015-11-13 DIAGNOSIS — Z87891 Personal history of nicotine dependence: Secondary | ICD-10-CM | POA: Insufficient documentation

## 2015-11-13 DIAGNOSIS — F1910 Other psychoactive substance abuse, uncomplicated: Secondary | ICD-10-CM | POA: Diagnosis not present

## 2015-11-13 DIAGNOSIS — Z79899 Other long term (current) drug therapy: Secondary | ICD-10-CM | POA: Insufficient documentation

## 2015-11-13 DIAGNOSIS — Z811 Family history of alcohol abuse and dependence: Secondary | ICD-10-CM | POA: Diagnosis not present

## 2015-11-13 DIAGNOSIS — Z813 Family history of other psychoactive substance abuse and dependence: Secondary | ICD-10-CM | POA: Diagnosis not present

## 2015-11-13 DIAGNOSIS — Z8249 Family history of ischemic heart disease and other diseases of the circulatory system: Secondary | ICD-10-CM | POA: Diagnosis not present

## 2015-11-13 DIAGNOSIS — M797 Fibromyalgia: Secondary | ICD-10-CM | POA: Diagnosis not present

## 2015-11-13 DIAGNOSIS — F419 Anxiety disorder, unspecified: Secondary | ICD-10-CM | POA: Insufficient documentation

## 2015-11-13 DIAGNOSIS — IMO0002 Reserved for concepts with insufficient information to code with codable children: Secondary | ICD-10-CM

## 2015-11-13 DIAGNOSIS — Z765 Malingerer [conscious simulation]: Secondary | ICD-10-CM | POA: Insufficient documentation

## 2015-11-13 DIAGNOSIS — R109 Unspecified abdominal pain: Secondary | ICD-10-CM | POA: Diagnosis present

## 2015-11-13 HISTORY — DX: Unspecified abnormal cytological findings in specimens from vagina: R87.629

## 2015-11-13 HISTORY — DX: Malingerer (conscious simulation): Z76.5

## 2015-11-13 HISTORY — DX: Opioid abuse, uncomplicated: F11.10

## 2015-11-13 LAB — URINALYSIS, ROUTINE W REFLEX MICROSCOPIC
BILIRUBIN URINE: NEGATIVE
Glucose, UA: NEGATIVE mg/dL
Hgb urine dipstick: NEGATIVE
KETONES UR: NEGATIVE mg/dL
LEUKOCYTES UA: NEGATIVE
NITRITE: NEGATIVE
PH: 5.5 (ref 5.0–8.0)
Protein, ur: NEGATIVE mg/dL
Specific Gravity, Urine: >1.03 — ABNORMAL HIGH (ref 1.005–1.030)

## 2015-11-13 LAB — POCT PREGNANCY, URINE: Preg Test, Ur: NEGATIVE

## 2015-11-13 NOTE — MAU Provider Note (Signed)
History     CSN: [id number]  Arrival date and time: 11/13/15 4098   First Provider Initiated Contact with Patient 11/13/15 1831      Chief Complaint  Patient presents with  . Abdominal Pain   HPI   [name] is 35 y.o. female [phone] with a history of endometriosis, and chronic pain here with "endometriosis pain" here for a pain medication refill.  This is the same pain she normally has with her endometriosis. " It feels like a pinching pain".  The pain is worse in the center of her abdomen, just below her belly button.   She is scheduled on June 12th for a hysterectomy with Dr. Jackelyn Knife.  She sees [facility];  Dr. Tollie Eth for pain management and states that she does not have a pain contract with him and he is ok with her getting "substitute" pain medication from Dr. Jackelyn Knife. She called Dr. Jackelyn Knife on Friday afternoon requesting a refill on her pain medication. She says she never received a call back.   She is scheduled to see Dr. Tollie Eth on Wednesday for pain management however needs to have something to get her through.   Per [place] controlled substance reporting system the patient was given #120 oxycodone 20 mg tablets; when asked about this medication the patient states that "some pills were destroyed".  See below for further description.   OB History    Gravida Para Term Preterm AB TAB SAB Ectopic Multiple Living   Past Medical History  Diagnosis Date  . Abortion in first trimester 03/1999  . Depression   . Anxiety     hx panic disorder  . Recurrent upper respiratory infection (URI)     05/14/2011 - tx with otc  . Seizures (HCC)     last one 2002 - no meds  . Fibromyalgia   . Endometriosis   . PIH (pregnancy induced hypertension), previous postpartum condition   . History of ectopic pregnancy 2003    Past Surgical History  Procedure Laterality Date  . Dilation and evacuation  12/2001  . Colposcopy    . Wisdom tooth  extraction  2002  . Spontaneous vaginal child birth   07/2004, 08/2009    x 2  . Laparoscopic tubal ligation  05/23/2011    Procedure: LAPAROSCOPIC TUBAL LIGATION;  Surgeon: Zenaida Niece, MD;  Location: [facility];  Service: Gynecology;  Laterality: Bilateral;  . Iud removal  05/23/2011    Procedure: INTRAUTERINE DEVICE (IUD) REMOVAL;  Surgeon: Zenaida Niece, MD;  Location: [facility];  Service: Gynecology;;  . Endometrial ablation  05/23/2011    Procedure: ENDOMETRIAL ABLATION;  Surgeon: Zenaida Niece, MD;  Location: [facility];  Service: Gynecology;;  . Dilation and curettage of uterus  2000    Family History  Problem Relation Age of Onset  . Diabetes Mother   . Hyperlipidemia Mother   . Hypertension Mother   . Heart disease Father   . Asthma Father   . Hypertension Father   . Breast cancer Mother   . Diabetes Paternal Grandfather   . Diabetes Maternal Grandmother   . OCD Brother   . Post-traumatic stress disorder Brother   . Alcohol abuse Brother   . Drug abuse Brother     tobacco abuse  . Autism spectrum disorder Son     Social History  Substance Use Topics  . Smoking status: Former Smoker --  1.00 packs/day for 6 years    Types: Cigarettes    Quit date: 06/26/2006  . Smokeless tobacco: Never Used  . Alcohol Use: No    Allergies:  Allergies  Allergen Reactions  . Prednisone     "I just can't take them. They make me feel bad."  . Amoxicillin Rash    Has patient had a PCN reaction causing immediate rash, facial/tongue/throat swelling, SOB or lightheadedness with hypotension: Yes Has patient had a PCN reaction causing severe rash involving mucus membranes or skin necrosis: Yes Has patient had a PCN reaction that required hospitalization No Has patient had a PCN reaction occurring within the last 10 years: No If all of the above answers are "NO", then may proceed with Cephalosporin use.    Prescriptions prior to admission  Medication Sig Dispense Refill Last Dose  .  amitriptyline (ELAVIL) 100 MG tablet Take 100 mg by mouth at bedtime.   11/13/2015 at Unknown time  . busPIRone (BUSPAR) 15 MG tablet Take 15 mg by mouth 2 (two) times daily.  0 11/13/2015 at Unknown time  . gabapentin (NEURONTIN) 300 MG capsule Take 600-900 mg by mouth 3 (three) times daily. Take 600 mg every morning, take 600 mg every day at midday, take 900 mg every night at bedtime.  1 11/13/2015 at Unknown time  . methocarbamol (ROBAXIN) 750 MG tablet Take 750 mg by mouth daily.   11/13/2015 at Unknown time  . venlafaxine XR (EFFEXOR-XR) 150 MG 24 hr capsule Take 150 mg by mouth daily with breakfast.   11/13/2015 at Unknown time  . ALPRAZolam (XANAX) 1 MG tablet Take 1 mg up to three times daily on days 1 and 2 Then take 1 mg up to twice daily on days 3, 4 and 5 Then take 1 mg once daily on days 6, 7 and 8 After this 8 day course you should be off the medication without complication (Patient not taking: Reported on 11/13/2015) 15 tablet 0   . oxyCODONE-acetaminophen (PERCOCET/ROXICET) 5-325 MG tablet Take 1 tablet by mouth every 4 (four) hours as needed for severe pain. (Patient not taking: Reported on 11/13/2015) 12 tablet 0 10/05/2015 at 1900   Results for orders placed or performed during the hospital encounter of 11/13/15 (from the past 48 hour(s))  Urinalysis, Routine w reflex microscopic (not at [facility])     Status: Abnormal   Collection Time: 11/13/15  6:08 PM  Result Value Ref Range   Color, Urine YELLOW YELLOW   APPearance CLEAR CLEAR   Specific Gravity, Urine >1.030 (H) 1.005 - 1.030   pH 5.5 5.0 - 8.0   Glucose, UA NEGATIVE NEGATIVE mg/dL   Hgb urine dipstick NEGATIVE NEGATIVE   Bilirubin Urine NEGATIVE NEGATIVE   Ketones, ur NEGATIVE NEGATIVE mg/dL   Protein, ur NEGATIVE NEGATIVE mg/dL   Nitrite NEGATIVE NEGATIVE   Leukocytes, UA NEGATIVE NEGATIVE    Comment: MICROSCOPIC NOT DONE ON URINES WITH NEGATIVE PROTEIN, BLOOD, LEUKOCYTES, NITRITE, OR GLUCOSE <1000 mg/dL.  Pregnancy, urine  POC     Status: None   Collection Time: 11/13/15  6:19 PM  Result Value Ref Range   Preg Test, Ur NEGATIVE NEGATIVE    Comment:        THE SENSITIVITY OF THIS METHODOLOGY IS >24 mIU/mL     Review of Systems  Constitutional: Negative for fever and chills.  Gastrointestinal: Negative for heartburn, nausea, vomiting and abdominal pain.  Genitourinary: Negative for dysuria and urgency.   Physical Exam   Blood  pressure 135/92, pulse 95, temperature 98.4 F (36.9 C), temperature source Oral, resp. rate 16.  Physical Exam  Constitutional: She is oriented to person, place, and time. She appears well-developed and well-nourished. No distress.  HENT:  Head: Normocephalic.  GI: Soft. Normal appearance. There is generalized tenderness. There is no rigidity, no rebound and no guarding.  Musculoskeletal: Normal range of motion.  Neurological: She is alert and oriented to person, place, and time.  Skin: She is not diaphoretic.    MAU Course  Procedures  None  MDM  Per the [place] controlled substance registry the patient has been given the following:  11/03/15: Hydrocodone 10/325 mg #84 5/9:17: Dextroamphetamine 10 mg #90 10/20/15: Oxycodone 20 mg #120 10/13/15: Oxycodone 10 mg # 70 10/12/15: Clonazepam 0.5 mg #90 4/17:17: Oxycodone 5/325 mg #15 10/06/15: Oxycodone 5/325 mg #45  Discussed patient with Dr. Ellyn HackBovard. Reviewed recent RX's from [place] controlled substance registry with Dr. Ellyn HackBovard.   Assessment and Plan   A:  1. Chronic generalized abdominal pain   2. Endometriosis of pelvis   3. Drug-seeking behavior     P:  Discharge home in stable condition Declined patient's request for refill on narcotics; no narcotics were given at this visit The patient is encouraged to follow up with Dr. Tollie EthPlummer with pain management for discussion of pain control  Return to MAU for emergencies  Duane LopeJennifer I Rasch, NP 11/15/2015 8:36 AM

## 2015-11-13 NOTE — MAU Note (Addendum)
Pt states she has endometriosis and she normally gets pain control in the office.  Pt states she called the office on Friday and they were supposed to call her back and was told her doctor was out until Monday so they could not get her the medication until Monday.  Pt states she normally take Pecocet.  Pt states she has a hysterectomy scheduled for the 12th of June.

## 2015-11-13 NOTE — Discharge Instructions (Signed)

## 2015-11-15 ENCOUNTER — Other Ambulatory Visit: Payer: 59

## 2015-11-15 ENCOUNTER — Encounter (HOSPITAL_COMMUNITY): Payer: Self-pay | Admitting: Obstetrics and Gynecology

## 2015-11-23 ENCOUNTER — Inpatient Hospital Stay: Admission: RE | Admit: 2015-11-23 | Payer: 59 | Source: Ambulatory Visit

## 2015-11-23 ENCOUNTER — Other Ambulatory Visit: Payer: 59

## 2015-11-25 ENCOUNTER — Inpatient Hospital Stay (HOSPITAL_COMMUNITY): Admission: RE | Admit: 2015-11-25 | Payer: 59 | Source: Ambulatory Visit

## 2015-11-30 ENCOUNTER — Encounter: Payer: 59 | Admitting: Neurology

## 2015-12-06 ENCOUNTER — Encounter (HOSPITAL_COMMUNITY): Admission: RE | Payer: Self-pay | Source: Ambulatory Visit

## 2015-12-06 ENCOUNTER — Ambulatory Visit (HOSPITAL_COMMUNITY): Admission: RE | Admit: 2015-12-06 | Payer: 59 | Source: Ambulatory Visit | Admitting: Obstetrics and Gynecology

## 2015-12-06 SURGERY — HYSTERECTOMY, VAGINAL, LAPAROSCOPY-ASSISTED
Anesthesia: General

## 2015-12-14 ENCOUNTER — Ambulatory Visit: Payer: 59 | Admitting: Adult Health

## 2015-12-16 ENCOUNTER — Ambulatory Visit: Payer: 59 | Admitting: Adult Health

## 2015-12-21 ENCOUNTER — Ambulatory Visit: Payer: 59 | Admitting: Adult Health

## 2015-12-22 ENCOUNTER — Encounter: Payer: Self-pay | Admitting: Adult Health

## 2016-01-12 ENCOUNTER — Encounter: Payer: 59 | Admitting: Neurology

## 2016-01-13 ENCOUNTER — Encounter: Payer: Self-pay | Admitting: Neurology

## 2016-01-17 ENCOUNTER — Ambulatory Visit: Payer: 59 | Admitting: Cardiovascular Disease

## 2016-01-18 ENCOUNTER — Encounter: Payer: Self-pay | Admitting: *Deleted

## 2016-02-22 ENCOUNTER — Emergency Department (HOSPITAL_COMMUNITY)
Admission: EM | Admit: 2016-02-22 | Discharge: 2016-02-22 | Discharge status: Against Medical Advice | Payer: 59 | Attending: Dermatology | Admitting: Dermatology

## 2016-02-22 DIAGNOSIS — Z79899 Other long term (current) drug therapy: Secondary | ICD-10-CM | POA: Diagnosis not present

## 2016-02-22 DIAGNOSIS — F419 Anxiety disorder, unspecified: Secondary | ICD-10-CM | POA: Diagnosis not present

## 2016-02-22 DIAGNOSIS — F1290 Cannabis use, unspecified, uncomplicated: Secondary | ICD-10-CM | POA: Insufficient documentation

## 2016-02-22 DIAGNOSIS — F1110 Opioid abuse, uncomplicated: Secondary | ICD-10-CM | POA: Insufficient documentation

## 2016-02-22 DIAGNOSIS — Z5321 Procedure and treatment not carried out due to patient leaving prior to being seen by health care provider: Secondary | ICD-10-CM | POA: Diagnosis not present

## 2016-02-22 DIAGNOSIS — Z87891 Personal history of nicotine dependence: Secondary | ICD-10-CM | POA: Diagnosis not present

## 2016-02-22 NOTE — ED Notes (Signed)
Pt states she feels like the anxiety she was feeling was alleviated sitting in the lobby Pt left before triage

## 2016-05-12 ENCOUNTER — Encounter (HOSPITAL_COMMUNITY): Payer: Self-pay | Admitting: Emergency Medicine

## 2016-05-12 ENCOUNTER — Emergency Department (HOSPITAL_COMMUNITY)
Admission: EM | Admit: 2016-05-12 | Discharge: 2016-05-13 | Disposition: A | Discharge status: Home / Self Care | Payer: 59 | Attending: Emergency Medicine | Admitting: Emergency Medicine

## 2016-05-12 ENCOUNTER — Emergency Department (HOSPITAL_COMMUNITY): Payer: 59

## 2016-05-12 DIAGNOSIS — Z87891 Personal history of nicotine dependence: Secondary | ICD-10-CM | POA: Insufficient documentation

## 2016-05-12 DIAGNOSIS — F419 Anxiety disorder, unspecified: Secondary | ICD-10-CM | POA: Diagnosis present

## 2016-05-12 DIAGNOSIS — F410 Panic disorder [episodic paroxysmal anxiety] without agoraphobia: Secondary | ICD-10-CM

## 2016-05-12 LAB — BASIC METABOLIC PANEL
ANION GAP: 10 (ref 5–15)
BUN: 10 mg/dL (ref 6–20)
CHLORIDE: 101 mmol/L (ref 101–111)
CO2: 26 mmol/L (ref 22–32)
Calcium: 9.2 mg/dL (ref 8.9–10.3)
Creatinine, Ser: 0.71 mg/dL (ref 0.44–1.00)
GFR calc Af Amer: >60 mL/min (ref >60)
GFR calc non Af Amer: >60 mL/min (ref >60)
GLUCOSE: 200 mg/dL — AB (ref 65–99)
POTASSIUM: 3.4 mmol/L — AB (ref 3.5–5.1)
Sodium: 137 mmol/L (ref 135–145)

## 2016-05-12 LAB — CBC
HEMATOCRIT: 38.8 % (ref 36.0–46.0)
HEMOGLOBIN: 13.7 g/dL (ref 12.0–15.0)
MCH: 30.7 pg (ref 26.0–34.0)
MCHC: 35.3 g/dL (ref 30.0–36.0)
MCV: 87 fL (ref 78.0–100.0)
Platelets: 294 10*3/uL (ref 150–400)
RBC: 4.46 MIL/uL (ref 3.87–5.11)
RDW: 12.5 % (ref 11.5–15.5)
WBC: 7 10*3/uL (ref 4.0–10.5)

## 2016-05-12 LAB — I-STAT BETA HCG BLOOD, ED (MC, WL, AP ONLY)

## 2016-05-12 LAB — I-STAT TROPONIN, ED: Troponin i, poc: 0 ng/mL (ref 0.00–0.08)

## 2016-05-12 NOTE — ED Triage Notes (Signed)
C/o sharp pain to center of chest with sob x 2 hours.  States she had nausea earlier in the day but it resolved.  Reports history of anxiety and pt is requesting anxiety medication.

## 2016-05-13 LAB — D-DIMER, QUANTITATIVE (NOT AT ARMC): D DIMER QUANT: 0.42 ug{FEU}/mL (ref 0.00–0.50)

## 2016-05-13 MED ORDER — CLONAZEPAM 0.5 MG PO TABS: 0.5000 mg | ORAL_TABLET | Freq: Two times a day (BID) | ORAL | 0 refills | Status: DC | PRN

## 2016-05-13 NOTE — ED Notes (Signed)
Pt verbalized understanding of d/c instructions and has no further questions. Pt is stable, A&Ox4, VSS.  

## 2016-05-13 NOTE — ED Provider Notes (Signed)
[facility]PT Provider Note   CSN: [id number] Arrival date & time: 05/12/16  2218  History   Chief Complaint Chief Complaint  Patient presents with  . Chest Pain  . Anxiety   HPI Emily Woodard is a 35 y.o. female.  HPI    Patient to the ER with past medical history of narcotic abuse, depression, drug-seeking behavior, anxiety attacks, seizures, see ER complaining of anxiety attack with associated shortness of breath and chest pain lasting for one hour before she decided to come to the emergency department. On arrival she was still having anxiety sensation of her heart racing which resolved why she was in the waiting room. Once her Klonopin to affect she reports no more symptoms of shortness of breath, chest pain, heart racing. She otherwise is feeling well. She says that this is happened before but that her panic attacks are increasing in frequency and severity.   No LE swelling, abdominal pain, confusion, weakness, back pain, N/V/D.   Past Medical History:  Diagnosis Date  . Abortion in first trimester 03/1999  . Anxiety    hx panic disorder  . Depression   . Drug-seeking behavior 10/2015  . Endometriosis   . Fibromyalgia   . History of ectopic pregnancy 2003  . Narcotic abuse 10/2015  . PIH (pregnancy induced hypertension), previous postpartum condition   . Recurrent upper respiratory infection (URI)    05/14/2011 - tx with otc  . Seizures (HCC)    last one 2002 - no meds  . Vaginal Pap smear, abnormal     Patient Active Problem List   Diagnosis Date Noted  . Chronic fatigue fibromyalgia syndrome 09/15/2015  . Myopathy, unspecified 09/15/2015  . 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 Arthralgia of cervical spine 09/15/2015  . Bipolar disorder with depression (HCC) 09/15/2015  . Endometriosis of pelvis 05/23/2011    Past Surgical History:  Procedure Laterality Date  . COLPOSCOPY    . DILATION AND CURETTAGE OF UTERUS  2000  . DILATION AND EVACUATION   12/2001  . ENDOMETRIAL ABLATION  05/23/2011   Procedure: ENDOMETRIAL ABLATION;  Surgeon: Zenaida Niece, MD;  Location: [facility];  Service: Gynecology;;  . IUD REMOVAL  05/23/2011   Procedure: INTRAUTERINE DEVICE (IUD) REMOVAL;  Surgeon: Zenaida Niece, MD;  Location: [facility];  Service: Gynecology;;  . LAPAROSCOPIC TUBAL LIGATION  05/23/2011   Procedure: LAPAROSCOPIC TUBAL LIGATION;  Surgeon: Zenaida Niece, MD;  Location: [facility];  Service: Gynecology;  Laterality: Bilateral;  . spontaneous vaginal child birth   07/2004, 08/2009   x 2  . WISDOM TOOTH EXTRACTION  2002    OB History    Gravida Para Term Preterm AB Living   4 2 2   1 2    SAB TAB Ectopic Multiple Live Births       1           Home Medications    Prior to Admission medications   Medication Sig Start Date End Date Taking? Authorizing Provider  amitriptyline (ELAVIL) 100 MG tablet Take 100 mg by mouth at bedtime.   Yes Historical Provider, MD  buprenorphine (SUBUTEX) 2 MG SUBL SL tablet Place 8 mg under the tongue daily.   Yes Historical Provider, MD  busPIRone (BUSPAR) 15 MG tablet Take 15 mg by mouth 2 (two) times daily. 07/01/15  Yes Historical Provider, MD  cloNIDine (CATAPRES) 0.1 MG tablet Take 0.1 mg by mouth daily as needed. For anxiety or blood pressure   Yes Historical  Provider, MD  gabapentin (NEURONTIN) 300 MG capsule Take 600-900 mg by mouth 3 (three) times daily. Take 600 mg every morning, take 600 mg every day at midday, take 900 mg every night at bedtime. 11/11/14  Yes Historical Provider, MD  hydrOXYzine (ATARAX/VISTARIL) 10 MG tablet Take 10 mg by mouth 2 (two) times daily.   Yes Historical Provider, MD  ibuprofen (ADVIL,MOTRIN) 800 MG tablet Take 800 mg by mouth every 8 (eight) hours as needed for mild pain or moderate pain.   Yes Historical Provider, MD  lurasidone (LATUDA) 40 MG TABS tablet Take 40 mg by mouth daily with breakfast.   Yes Historical Provider, MD  methocarbamol (ROBAXIN) 750 MG tablet Take  750 mg by mouth daily.   Yes Historical Provider, MD  venlafaxine XR (EFFEXOR-XR) 150 MG 24 hr capsule Take 150 mg by mouth daily with breakfast.   Yes Historical Provider, MD  clonazePAM (KLONOPIN) 0.5 MG tablet Take 1 tablet (0.5 mg total) by mouth 2 (two) times daily as needed for anxiety. 05/13/16   Marlon Peliffany Laticia Vannostrand, PA-C    Family History Family History  Problem Relation Age of Onset  . Diabetes Mother   . Hyperlipidemia Mother   . Hypertension Mother   . Breast cancer Mother   . Heart disease Father   . Asthma Father   . Hypertension Father   . Diabetes Paternal Grandfather   . Diabetes Maternal Grandmother   . OCD Brother   . Post-traumatic stress disorder Brother   . Alcohol abuse Brother   . Drug abuse Brother     tobacco abuse  . Autism spectrum disorder Son     Social History Social History  Substance Use Topics  . Smoking status: Former Smoker    Packs/day: 1.00    Years: 6.00    Types: Cigarettes    Quit date: 06/26/2006  . Smokeless tobacco: Never Used  . Alcohol use No     Allergies   Prednisone and Amoxicillin   Review of Systems Review of Systems  Review of Systems All other systems negative except as documented in the HPI. All pertinent positives and negatives as reviewed in the HPI.  Physical Exam Updated Vital Signs BP 100/79   Pulse 91   Temp 98.1 F (36.7 C) (Oral)   Resp 18   SpO2 100%   Physical Exam  Constitutional: She appears well-developed and well-nourished. No distress.  HENT:  Head: Normocephalic and atraumatic.  Right Ear: Tympanic membrane and ear canal normal.  Left Ear: Tympanic membrane and ear canal normal.  Nose: Nose normal.  Mouth/Throat: Uvula is midline, oropharynx is clear and moist and mucous membranes are normal.  Eyes: Pupils are equal, round, and reactive to light.  Neck: Normal range of motion. Neck supple.  Cardiovascular: Normal rate and regular rhythm.   Pulmonary/Chest: Effort normal.  Abdominal: Soft.   No signs of abdominal distention  Musculoskeletal:  No LE swelling  Neurological: She is alert.  Acting at baseline  Skin: Skin is warm and dry. No rash noted.  Psychiatric: Her mood appears anxious.  Nursing note and vitals reviewed.   ED Treatments / Results  Labs (all labs ordered are listed, but only abnormal results are displayed) Labs Reviewed  BASIC METABOLIC PANEL - Abnormal; Notable for the following:       Result Value   Potassium 3.4 (*)    Glucose, Bld 200 (*)    All other components within normal limits  CBC  D-DIMER, QUANTITATIVE (NOT  AT [facility])  Rosezena SensorI-STAT TROPOININ, ED  I-STAT BETA HCG BLOOD, ED [facility], [facility], [facility] ONLY)    EKG  EKG Interpretation  Date/Time:  Friday May 12 2016 22:30:48 EST Ventricular Rate:  101 PR Interval:  152 QRS Duration: 84 QT Interval:  342 QTC Calculation: 443 R Axis:   50 Text Interpretation:  Sinus tachycardia Otherwise normal ECG No significant change since last tracing Confirmed by Bebe ShaggyWICKLINE  MD, DONALD ([id number]) on 05/13/2016 12:41:45 AM       Radiology Dg Chest 2 View  Result Date: 05/12/2016 CLINICAL DATA:  Acute onset of sharp central chest pain and shortness of breath. Nausea. Initial encounter. EXAM: CHEST  2 VIEW COMPARISON:  Chest radiograph performed 08/03/2015 FINDINGS: The lungs are well-aerated. Pulmonary vascularity is at the upper limits of normal. There is no evidence of focal opacification, pleural effusion or pneumothorax. The heart is normal in size; the mediastinal contour is within normal limits. No acute osseous abnormalities are seen. IMPRESSION: No acute cardiopulmonary process seen. Electronically Signed   By: Roanna RaiderJeffery  Chang M.D.   On: 05/12/2016 23:17    Procedures Procedures (including critical care time)  Medications Ordered in ED Medications - No data to display   Initial Impression / Assessment and Plan / ED Course  I have reviewed the triage vital signs and the nursing notes.  Pertinent labs &  imaging results that were available during my care of the patient were reviewed by me and considered in my medical decision making (see chart for details).  Clinical Course    Normal EKG, tachycardic on arrival during panic attack but now pulse is 84 in room without intervention.Reports Klonopin resolved her symptoms.  She is PERC negative with her current normal pulse rate. She plans to call her PCP on Monday to initiate daily medication for anxiety attacks. She has Vistaril at home which she takes daily but is now out of Klonopin, will give small refill.  I discussed results, diagnoses and plan with Emily Woodard. They voice there understanding and questions were answered. We discussed follow-up recommendations and return precautions.   Final Clinical Impressions(s) / ED Diagnoses   Final diagnoses:  Anxiety attack    New Prescriptions New Prescriptions   CLONAZEPAM (KLONOPIN) 0.5 MG TABLET    Take 1 tablet (0.5 mg total) by mouth 2 (two) times daily as needed for anxiety.     Marlon Peliffany Naviyah Schaffert, PA-C 05/13/16 0136    Zadie Rhineonald Wickline, MD 05/13/16 [phone]

## 2016-09-15 ENCOUNTER — Other Ambulatory Visit: Payer: Self-pay | Admitting: Physician Assistant

## 2016-09-15 DIAGNOSIS — R945 Abnormal results of liver function studies: Principal | ICD-10-CM

## 2016-09-15 DIAGNOSIS — R7989 Other specified abnormal findings of blood chemistry: Secondary | ICD-10-CM

## 2016-10-16 ENCOUNTER — Emergency Department (HOSPITAL_COMMUNITY): Payer: 59

## 2016-10-16 ENCOUNTER — Emergency Department (HOSPITAL_COMMUNITY)
Admission: EM | Admit: 2016-10-16 | Discharge: 2016-10-16 | Disposition: A | Discharge status: Home / Self Care | Payer: 59 | Attending: Emergency Medicine | Admitting: Emergency Medicine

## 2016-10-16 ENCOUNTER — Other Ambulatory Visit: Payer: Self-pay

## 2016-10-16 ENCOUNTER — Encounter (HOSPITAL_COMMUNITY): Payer: Self-pay

## 2016-10-16 DIAGNOSIS — R0789 Other chest pain: Secondary | ICD-10-CM | POA: Diagnosis not present

## 2016-10-16 DIAGNOSIS — Z79899 Other long term (current) drug therapy: Secondary | ICD-10-CM | POA: Insufficient documentation

## 2016-10-16 DIAGNOSIS — F419 Anxiety disorder, unspecified: Secondary | ICD-10-CM | POA: Insufficient documentation

## 2016-10-16 DIAGNOSIS — Z87891 Personal history of nicotine dependence: Secondary | ICD-10-CM | POA: Diagnosis not present

## 2016-10-16 LAB — BASIC METABOLIC PANEL
Anion gap: 11 (ref 5–15)
BUN: 10 mg/dL (ref 6–20)
CALCIUM: 9.1 mg/dL (ref 8.9–10.3)
CHLORIDE: 101 mmol/L (ref 101–111)
CO2: 26 mmol/L (ref 22–32)
CREATININE: 0.75 mg/dL (ref 0.44–1.00)
Glucose, Bld: 110 mg/dL — ABNORMAL HIGH (ref 65–99)
Potassium: 4.1 mmol/L (ref 3.5–5.1)
SODIUM: 138 mmol/L (ref 135–145)

## 2016-10-16 LAB — CBC
HEMATOCRIT: 42.4 % (ref 36.0–46.0)
HEMOGLOBIN: 14.5 g/dL (ref 12.0–15.0)
MCH: 30.8 pg (ref 26.0–34.0)
MCHC: 34.2 g/dL (ref 30.0–36.0)
MCV: 90 fL (ref 78.0–100.0)
Platelets: 308 10*3/uL (ref 150–400)
RBC: 4.71 MIL/uL (ref 3.87–5.11)
RDW: 12.8 % (ref 11.5–15.5)
WBC: 7.8 10*3/uL (ref 4.0–10.5)

## 2016-10-16 LAB — I-STAT TROPONIN, ED
Troponin i, poc: 0 ng/mL (ref 0.00–0.08)
Troponin i, poc: 0 ng/mL (ref 0.00–0.08)

## 2016-10-16 MED ORDER — CLONAZEPAM 0.5 MG PO TABS: 0.5000 mg | ORAL_TABLET | Freq: Every day | ORAL | 0 refills | Status: AC | PRN

## 2016-10-16 MED ORDER — CLONAZEPAM 0.5 MG PO TABS
0.5000 mg | ORAL_TABLET | Freq: Once | ORAL | Status: AC
  Administered 2016-10-16: 0.5 mg via ORAL
  Filled 2016-10-16: qty 1

## 2016-10-16 NOTE — ED Notes (Signed)
Pt verbalized understanding of d/c instructions and has no further questions. Pt is stable, A&Ox4, VSS.  

## 2016-10-16 NOTE — ED Triage Notes (Signed)
Pt here for non radiating chest pain. She reports recent stress after being laid off from her job this morning. Pt in no acute distress.

## 2016-10-16 NOTE — ED Provider Notes (Signed)
[facility] Provider Note   CSN: [id number] Arrival date & time: 10/16/16  1609     History   Chief Complaint Chief Complaint  Patient presents with  . Chest Pain    HPI Emily Woodard is a 36 y.o. female.  The history is provided by the patient. No language interpreter was used.  Chest Pain      Emily Woodard is a 36 y.o. female who presents to the Emergency Department complaining of chest pain.  She reports a central chest tightness and shortness of breath that started about 3 PM today. Symptoms are overall improving. She states this feels like prior anxiety symptoms. Today she was laid off from her job the last 3 years. She denies any fevers, cough, leg swelling, diaphoresis. She has a history of hypertension and she takes propranolol. She also has a history of fibromyalgia. No history of cardiac disease, DVT, PE. Her father has a history of heart problems with having an enlarged heart and requiring surgery but no family history of coronary artery disease.  Past Medical History:  Diagnosis Date  . Abortion in first trimester 03/1999  . Anxiety    hx panic disorder  . Depression   . Drug-seeking behavior 10/2015  . Endometriosis   . Fibromyalgia   . History of ectopic pregnancy 2003  . Narcotic abuse 10/2015  . PIH (pregnancy induced hypertension), previous postpartum condition   . Recurrent upper respiratory infection (URI)    05/14/2011 - tx with otc  . Seizures (HCC)    last one 2002 - no meds  . Vaginal Pap smear, abnormal     Patient Active Problem List   Diagnosis Date Noted  . Chronic fatigue fibromyalgia syndrome 09/15/2015  . Myopathy, unspecified 09/15/2015  . 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 Arthralgia of cervical spine 09/15/2015  . Bipolar disorder with depression (HCC) 09/15/2015  . Endometriosis of pelvis 05/23/2011    Past Surgical History:  Procedure Laterality Date  . COLPOSCOPY    . DILATION AND CURETTAGE OF UTERUS  2000  .  DILATION AND EVACUATION  12/2001  . ENDOMETRIAL ABLATION  05/23/2011   Procedure: ENDOMETRIAL ABLATION;  Surgeon: Zenaida Niece, MD;  Location: [facility];  Service: Gynecology;;  . IUD REMOVAL  05/23/2011   Procedure: INTRAUTERINE DEVICE (IUD) REMOVAL;  Surgeon: Zenaida Niece, MD;  Location: [facility];  Service: Gynecology;;  . LAPAROSCOPIC TUBAL LIGATION  05/23/2011   Procedure: LAPAROSCOPIC TUBAL LIGATION;  Surgeon: Zenaida Niece, MD;  Location: [facility];  Service: Gynecology;  Laterality: Bilateral;  . spontaneous vaginal child birth   07/2004, 08/2009   x 2  . WISDOM TOOTH EXTRACTION  2002    OB History    Gravida Para Term Preterm AB Living   SAB TAB Ectopic Multiple Live Births       1           Home Medications    Prior to Admission medications   Medication Sig Start Date End Date Taking? Authorizing Provider  amitriptyline (ELAVIL) 100 MG tablet Take 100 mg by mouth at bedtime.    Historical Provider, MD  buprenorphine (SUBUTEX) 2 MG SUBL SL tablet Place 8 mg under the tongue daily.    Historical Provider, MD  busPIRone (BUSPAR) 15 MG tablet Take 15 mg by mouth 2 (two) times daily. 07/01/15   Historical Provider, MD  clonazePAM (KLONOPIN) 0.5 MG tablet Take 1 tablet (0.5  mg total) by mouth daily as needed for anxiety. 10/16/16   Tilden Fossa, MD  cloNIDine (CATAPRES) 0.1 MG tablet Take 0.1 mg by mouth daily as needed. For anxiety or blood pressure    Historical Provider, MD  gabapentin (NEURONTIN) 300 MG capsule Take 600-900 mg by mouth 3 (three) times daily. Take 600 mg every morning, take 600 mg every day at midday, take 900 mg every night at bedtime. 11/11/14   Historical Provider, MD  hydrOXYzine (ATARAX/VISTARIL) 10 MG tablet Take 10 mg by mouth 2 (two) times daily.    Historical Provider, MD  ibuprofen (ADVIL,MOTRIN) 800 MG tablet Take 800 mg by mouth every 8 (eight) hours as needed for mild pain or moderate pain.    Historical Provider, MD  lurasidone  (LATUDA) 40 MG TABS tablet Take 40 mg by mouth daily with breakfast.    Historical Provider, MD  methocarbamol (ROBAXIN) 750 MG tablet Take 750 mg by mouth daily.    Historical Provider, MD  venlafaxine XR (EFFEXOR-XR) 150 MG 24 hr capsule Take 150 mg by mouth daily with breakfast.    Historical Provider, MD    Family History Family History  Problem Relation Age of Onset  . Diabetes Mother   . Hyperlipidemia Mother   . Hypertension Mother   . Breast cancer Mother   . Heart disease Father   . Asthma Father   . Hypertension Father   . Diabetes Paternal Grandfather   . Diabetes Maternal Grandmother   . OCD Brother   . Post-traumatic stress disorder Brother   . Alcohol abuse Brother   . Drug abuse Brother     tobacco abuse  . Autism spectrum disorder Son     Social History Social History  Substance Use Topics  . Smoking status: Former Smoker    Packs/day: 1.00    Years: 6.00    Types: Cigarettes    Quit date: 06/26/2006  . Smokeless tobacco: Never Used  . Alcohol use No     Allergies   Prednisone and Amoxicillin   Review of Systems Review of Systems  Cardiovascular: Positive for chest pain.  All other systems reviewed and are negative.    Physical Exam Updated Vital Signs BP 124/80   Pulse 80   Temp 98.2 F (36.8 C) (Oral)   Resp (!) 9   SpO2 96%   Physical Exam  Constitutional: She is oriented to person, place, and time. She appears well-developed and well-nourished.  HENT:  Head: Normocephalic and atraumatic.  Cardiovascular: Normal rate and regular rhythm.   No murmur heard. Pulmonary/Chest: Effort normal and breath sounds normal. No respiratory distress.  Abdominal: Soft. There is no tenderness. There is no rebound and no guarding.  Musculoskeletal: She exhibits no edema or tenderness.  Neurological: She is alert and oriented to person, place, and time.  Skin: Skin is warm and dry.  Psychiatric: She has a normal mood and affect. Her behavior is  normal.  Nursing note and vitals reviewed.    ED Treatments / Results  Labs (all labs ordered are listed, but only abnormal results are displayed) Labs Reviewed  BASIC METABOLIC PANEL - Abnormal; Notable for the following:       Result Value   Glucose, Bld 110 (*)    All other components within normal limits  CBC  I-STAT TROPOININ, ED  Rosezena Sensor, ED    EKG  EKG Interpretation  Date/Time:  Monday October 16 2016 16:13:47 EDT Ventricular Rate:  85 PR Interval:  176 QRS Duration: 84 QT Interval:  376 QTC Calculation: 447 R Axis:   65 Text Interpretation:  Normal sinus rhythm Cannot rule out Anterior infarct , age undetermined Abnormal ECG Baseline wander Confirmed by Lincoln Brigham [id number]) on 10/16/2016 7:58:14 PM       Radiology Dg Chest 2 View  Result Date: 10/16/2016 CLINICAL DATA:  Non radiating chest pain.  Recent stress. EXAM: CHEST  2 VIEW COMPARISON:  Chest radiograph May 12, 2016 FINDINGS: Cardiomediastinal silhouette is unremarkable for this low inspiratory examination with crowded vasculature markings. The lungs are clear without pleural effusions or focal consolidations. Trachea projects midline and there is no pneumothorax. Included soft tissue planes and osseous structures are non-suspicious. Moderate retained large bowel stool in the included abdomen. IMPRESSION: Stable examination:  No acute cardiopulmonary process. Electronically Signed   By: Awilda Metro M.D.   On: 10/16/2016 16:49    Procedures Procedures (including critical care time)  Medications Ordered in ED Medications  clonazePAM (KLONOPIN) tablet 0.5 mg (not administered)     Initial Impression / Assessment and Plan / ED Course  I have reviewed the triage vital signs and the nursing notes.  Pertinent labs & imaging results that were available during my care of the patient were reviewed by me and considered in my medical decision making (see chart for details).     Patient with  history of anxiety here for evaluation of chest pain. Pain is atypical for ACS, PE, dissection. EKG is similar to priors with wandering baseline with no acute ischemic changes. Patient is out of her Klonopin. No evidence of acute benzodiazepine withdrawal in the ED. Will provide refill until she is able to see her PCP. Discussed with patient risk of PCP not providing additional controlled medications. Offered prescription for Vistaril/Atarax and patient declines. Discussed home care, outpatient follow-up and return precautions.  Final Clinical Impressions(s) / ED Diagnoses   Final diagnoses:  Anxiety  Atypical chest pain    New Prescriptions New Prescriptions   CLONAZEPAM (KLONOPIN) 0.5 MG TABLET    Take 1 tablet (0.5 mg total) by mouth daily as needed for anxiety.     Tilden Fossa, MD 10/16/16 2043

## 2017-02-05 IMAGING — DX DG CHEST 2V
2 series · 2 of 2 positions shown · non-contrast
Comparison: Chest radiograph performed 08/03/2015

CLINICAL DATA: Acute onset of sharp central chest pain and
shortness of breath. Nausea. Initial encounter.

EXAM:
CHEST  2 VIEW

[w chest pa]
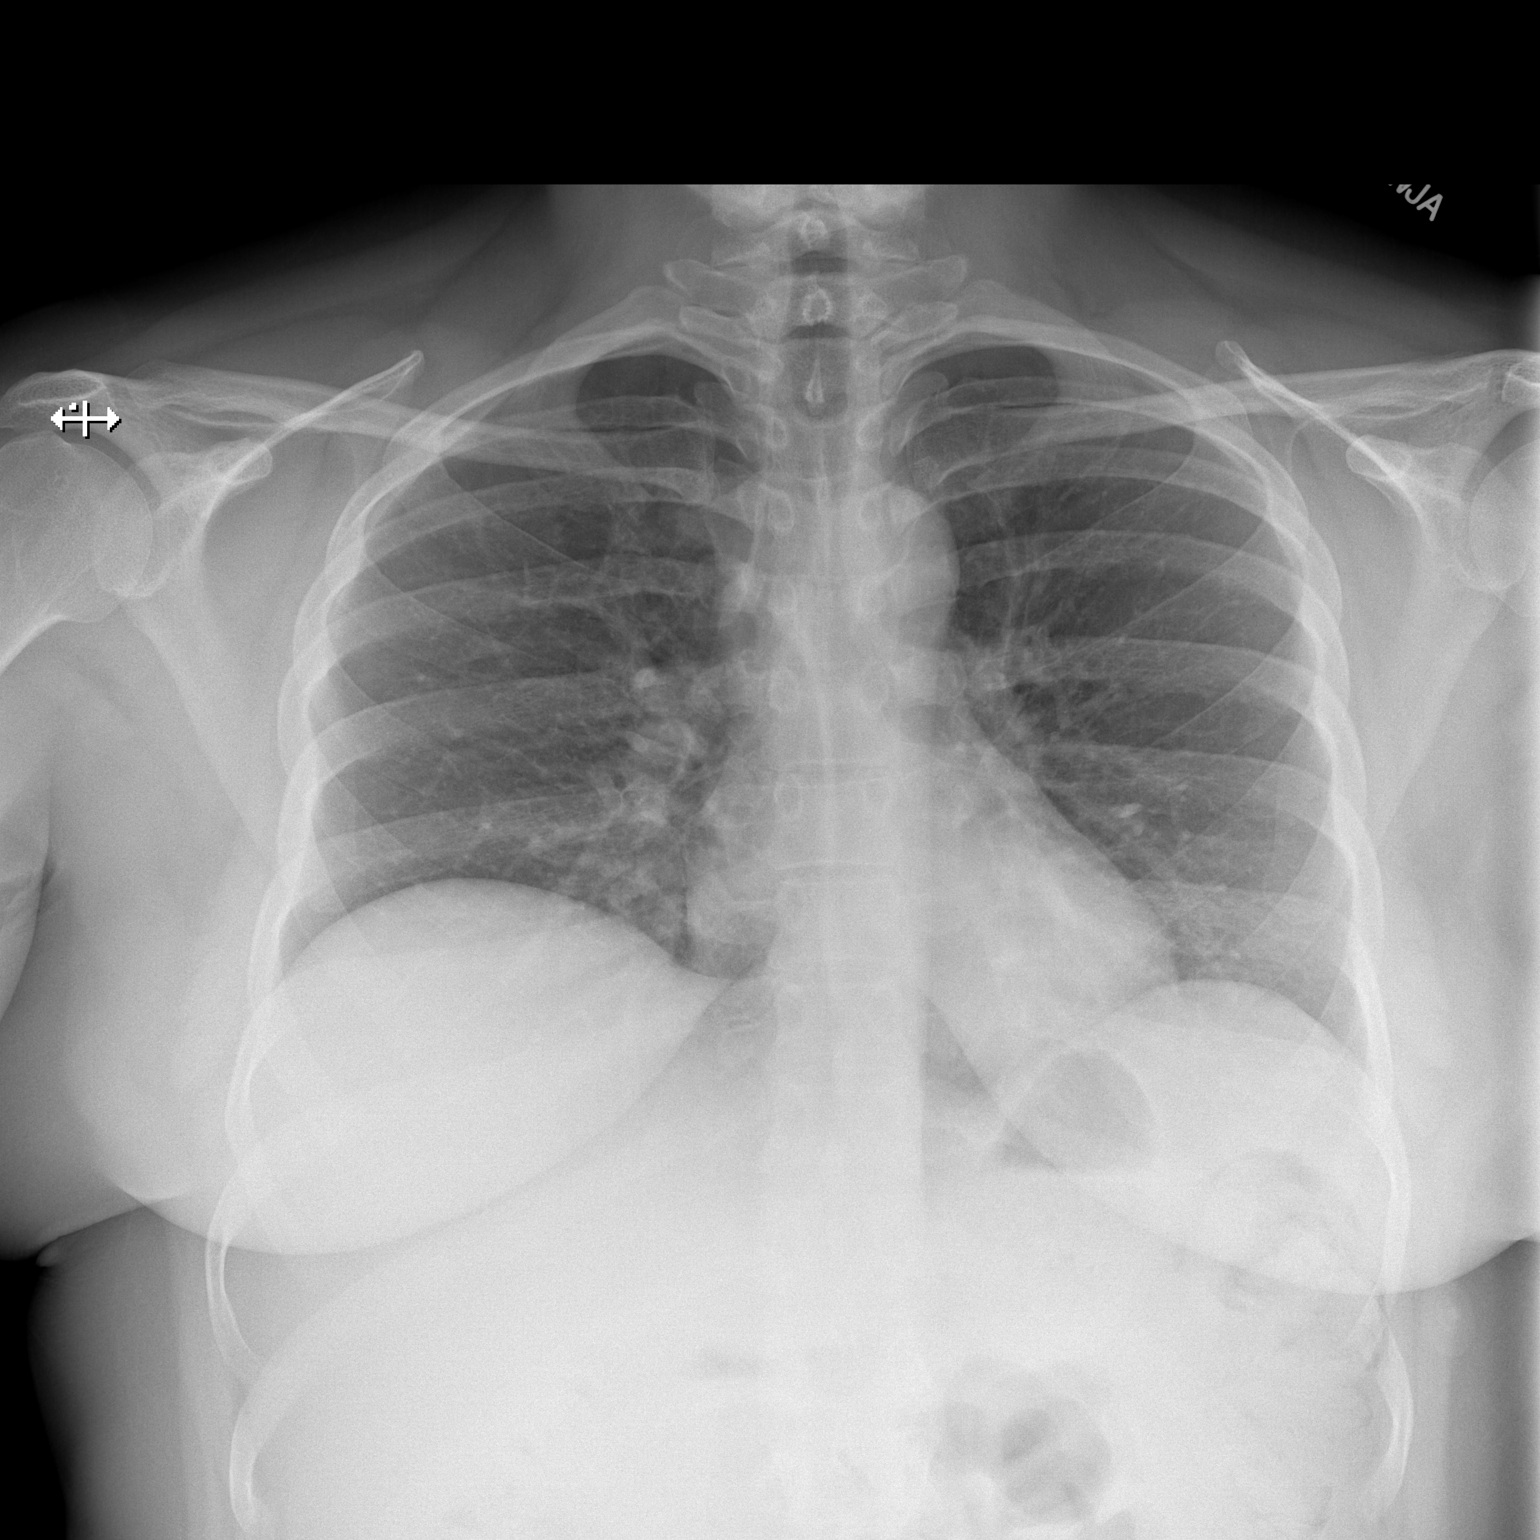

[w chest lat]
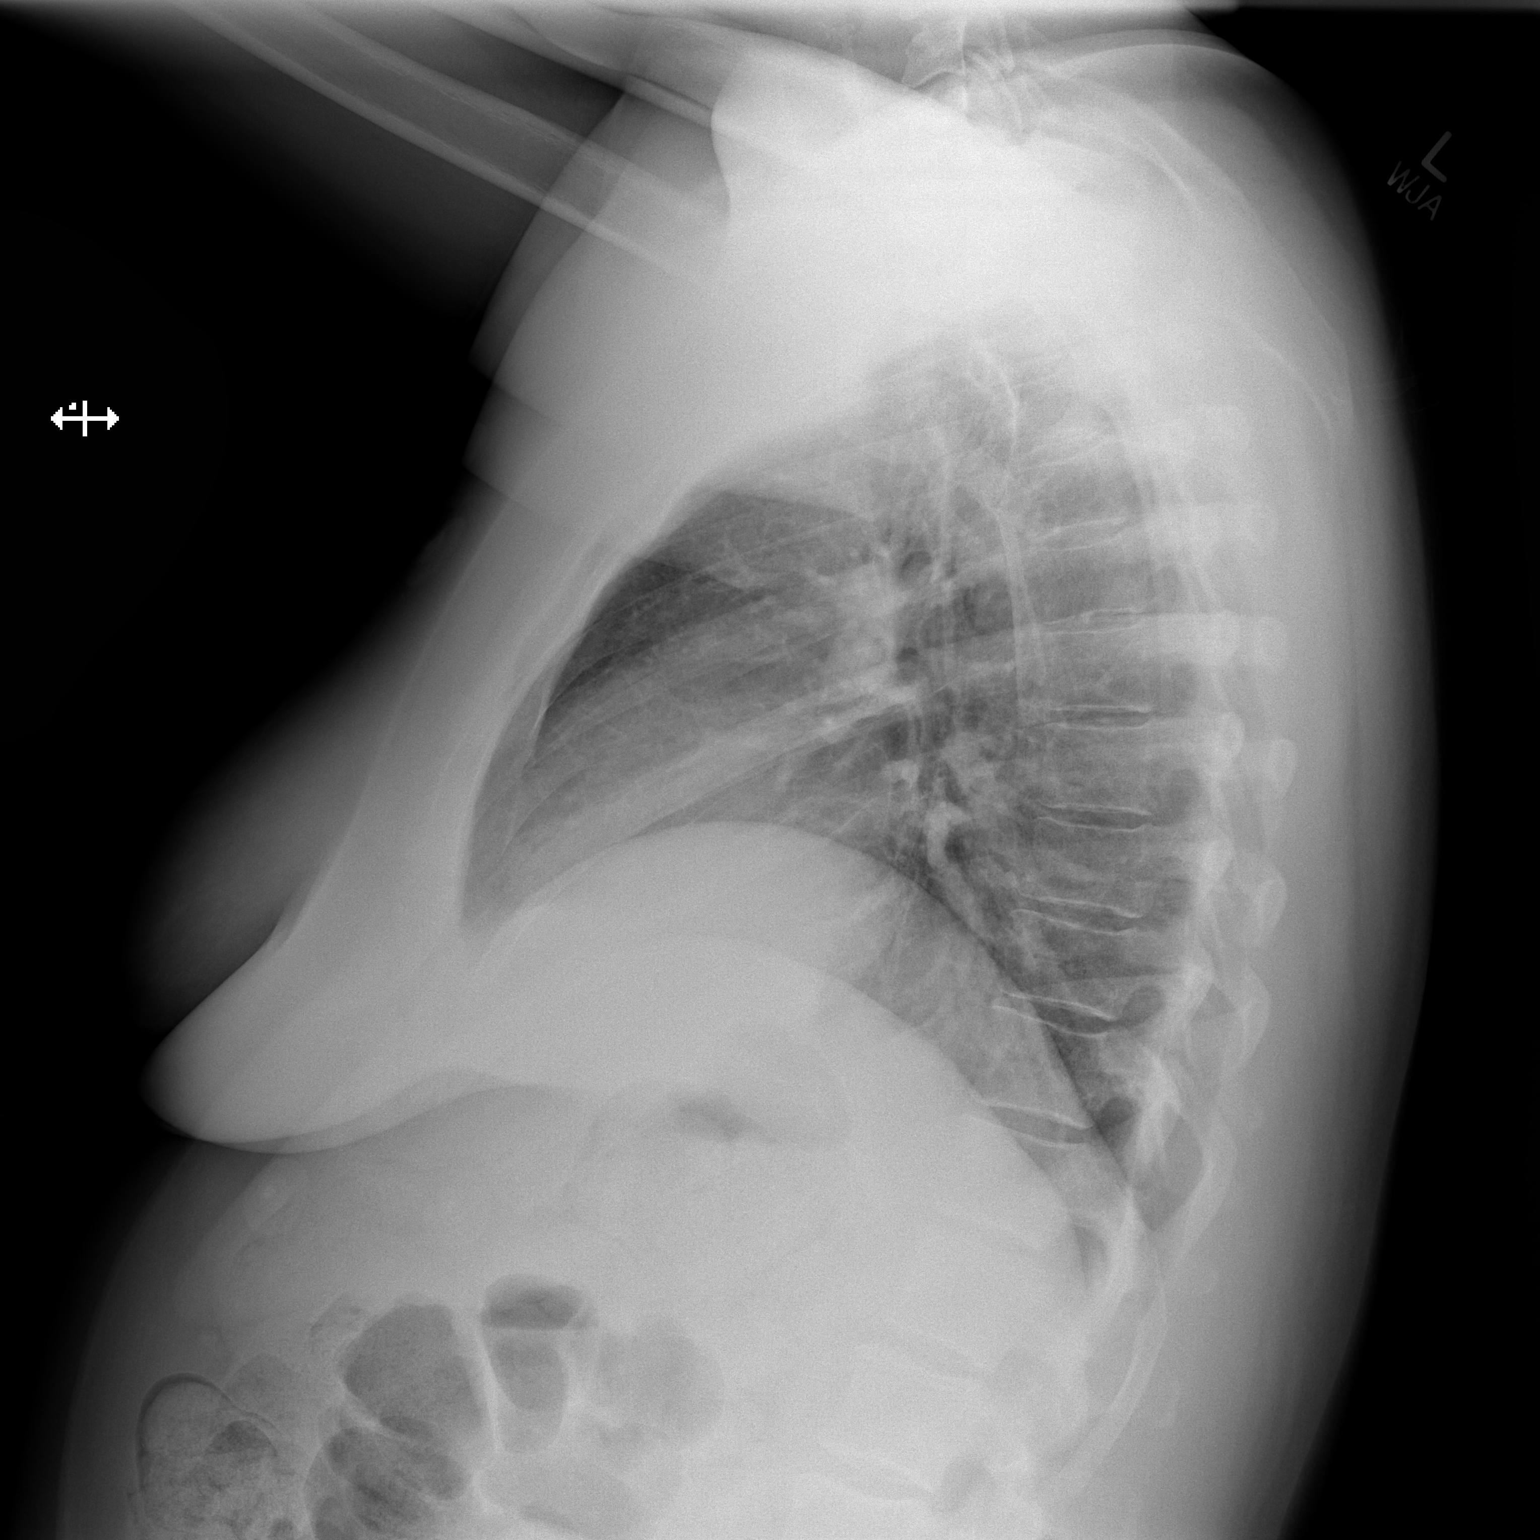

[2 of 2 positions shown; findings below may reference images not displayed]

FINDINGS: The lungs are well-aerated. Pulmonary vascularity is at the upper
limits of normal. There is no evidence of focal opacification,
pleural effusion or pneumothorax.

The heart is normal in size; the mediastinal contour is within
normal limits. No acute osseous abnormalities are seen.
IMPRESSION: No acute cardiopulmonary process seen.
